# Patient Record
Sex: Female | Born: 1970
Health system: Southern US, Community
[De-identification: ages and names within clinical notes are randomized; demographics above are authoritative.]

## PROBLEM LIST (undated history)

## (undated) HISTORY — PX: OTHER SURGICAL HISTORY: SHX169

## (undated) HISTORY — PX: DILATION AND CURETTAGE OF UTERUS: SHX78

## (undated) HISTORY — PX: MYRINGOTOMY: SUR874

---

## 1998-03-30 ENCOUNTER — Other Ambulatory Visit: Admission: RE | Admit: 1998-03-30 | Discharge: 1998-03-30 | Payer: Self-pay | Admitting: Gynecology

## 1998-04-18 ENCOUNTER — Inpatient Hospital Stay (HOSPITAL_COMMUNITY): Admission: AD | Admit: 1998-04-18 | Discharge: 1998-04-20 | Payer: Self-pay | Admitting: Obstetrics and Gynecology

## 1998-06-04 ENCOUNTER — Other Ambulatory Visit: Admission: RE | Admit: 1998-06-04 | Discharge: 1998-06-04 | Payer: Self-pay | Admitting: Gynecology

## 1999-09-26 ENCOUNTER — Other Ambulatory Visit: Admission: RE | Admit: 1999-09-26 | Discharge: 1999-09-26 | Payer: Self-pay | Admitting: Gynecology

## 2000-05-04 ENCOUNTER — Emergency Department (HOSPITAL_COMMUNITY): Admission: EM | Admit: 2000-05-04 | Discharge: 2000-05-05 | Payer: Self-pay | Admitting: Emergency Medicine

## 2000-06-22 ENCOUNTER — Ambulatory Visit (HOSPITAL_COMMUNITY): Admission: RE | Admit: 2000-06-22 | Discharge: 2000-06-22 | Payer: Self-pay | Admitting: Gynecology

## 2000-06-22 ENCOUNTER — Encounter (INDEPENDENT_AMBULATORY_CARE_PROVIDER_SITE_OTHER): Payer: Self-pay

## 2000-10-16 ENCOUNTER — Other Ambulatory Visit: Admission: RE | Admit: 2000-10-16 | Discharge: 2000-10-16 | Payer: Self-pay | Admitting: *Deleted

## 2001-05-15 ENCOUNTER — Inpatient Hospital Stay (HOSPITAL_COMMUNITY): Admission: AD | Admit: 2001-05-15 | Discharge: 2001-05-17 | Payer: Self-pay | Admitting: Gynecology

## 2001-06-26 ENCOUNTER — Other Ambulatory Visit: Admission: RE | Admit: 2001-06-26 | Discharge: 2001-06-26 | Payer: Self-pay | Admitting: *Deleted

## 2002-06-25 ENCOUNTER — Other Ambulatory Visit: Admission: RE | Admit: 2002-06-25 | Discharge: 2002-06-25 | Payer: Self-pay | Admitting: *Deleted

## 2005-02-02 ENCOUNTER — Other Ambulatory Visit: Admission: RE | Admit: 2005-02-02 | Discharge: 2005-02-02 | Payer: Self-pay | Admitting: Gynecology

## 2006-05-24 ENCOUNTER — Other Ambulatory Visit: Admission: RE | Admit: 2006-05-24 | Discharge: 2006-05-24 | Payer: Self-pay | Admitting: Gynecology

## 2011-05-05 NOTE — Op Note (Signed)
[facility]  Patient:    Rachael Murray, Rachael Murray                     MRN: [id number] Proc. Date: 06/22/00 Adm. Date:  [id number] Disc. Date: [id number] Attending:  Tonye Royalty                           Operative Report  PREOPERATIVE DIAGNOSIS:       First trimester missed abortion.  POSTOPERATIVE DIAGNOSIS:      First trimester missed abortion.  OPERATION:                    Dilatation and evacuation.  SURGEON:                      Juan H. Lily Peer, M.D.  ASSISTANT:  ANESTHESIA:                   MAC and paracervical block with 2% Xylocaine with 1:100,000 epinephrine.  ESTIMATED BLOOD LOSS:  INDICATIONS: The patient is a 40 year old gravida 3, para 1, AB 1, first trimester with evidence of intrauterine pregnancy but with nonviable gestational sac noted. Intrauterine and leveling off quantitative beta hCGs.  DESCRIPTION OF PROCEDURE: After the patient was adequately counseled, she was taken to the operating room where she was given MAC as a form of anesthesia. Her bladder was evacuated of its contents for approximately 50 cc. Examination demonstrated anteverted uterus, upper limits of normal, no palpable adnexal masses. A speculum was inserted into the vaginal vault. The vagina and perineum were prepped and draped in the usual sterile fashion. Then 2% Xylocaine with 1:100,000 epinephrine was infiltrated into the cervical stroma at the 2, 4, 8, and 10 oclock position for a total of 10 cc. The cervix required minimal dilatation with a Pratt dilator and following this, a Hunters curette was inserted into the uterine cavity and products of conception were scraped off in the intrauterine cavity. This was interchanged with a 7 mm suction curette and the specimen was passed off the operative field for histologic evaluation. Then 20 units of Pitocin was given in 500 cc of lactated ringers. The patient received 1 g of Cefotan for prophylaxis  and she was transferred to the recovery room with stable vital signs. She was to receive Toradol 30 mg IV, for postoperative analgesia. The vital signs were stable throughout the procedure. Blood type was 0 positive. IV fluids were 500 cc. Prophylaxis antibiotic with Cefotan 1 g IV. DD:  06/22/00 TD:  06/25/00 Job: [id number]

## 2011-05-05 NOTE — H&P (Signed)
[facility]  Patient:    Rachael Murray, Rachael Murray                     MRN: [id number] Adm. Date:  05/15/01 Dictator:   Gaetano Hawthorne. Lily Peer, M.D.                         History and Physical  CHIEF COMPLAINT:              Contractions.  HISTORY:                      The patient is a 40 year old gravida 4, para 1, AB 2, with a last menstrual period of August 03, 2000, estimated date of confinement May 23, 2001, currently at 38-6/7ths weeks gestation, who presented to [facility] at approximately 0120 hours complaining of contractions that started approximately at 2300 hours on May 28.  She was placed on the monitor.  She was found to be contracting every 2-3 minutes apart and her cervix was found to be 3-4 cm dilated, 80% effaced, vertex presentation, -2 station.  An isolated late deceleration was noted in maternity admissions down to 150 beats per minute which lasted for 50 seconds and it recovered and returned back to baseline.  Upon arrival to labor and delivery the patients cervix was found to be 6-7 cm, 80% effaced, -3 station with intact membranes.  There was questionable presentation and a bedside ultrasound reconfirmed indeed the fetus was in the vertex presentation.  The patients prenatal course is significant for the fact that the patient had been offered genetic testing due to the fact that her father-in-law has Huntington chorea, but her husband has never been tested for it and she had declined any further testing since her other pregnancy she stated everything went well with the exception that she had a positive group B strep culture. The patient also stated she has a sister who has two children with cleft lip and another one with autism.  Based on all the above, the patient had been recommended to undergo genetic counseling, but had declined.  Otherwise her prenatal course had been uneventful.  Of note, she also had declined maternal serum  alpha-fetoprotein testing as well.  Screening ultrasound did not demonstrate any gross anomalies.  ALLERGIES:                    She is allergic to ASPIRIN.  FAMILY HISTORY:               Huntingtons disease; the patient declined genetic testing and she also declined maternal serum alpha-fetoprotein testing.  OBSTETRICAL HISTORY:          She had a spontaneous AB in 1993 and also in 2001.  She did have a normal spontaneous vaginal delivery in May 1999, of an 8 pound 12 ounce baby at 39-1/[redacted] weeks gestation.  As noted above, the patient has had a nephew with autism.  REVIEW OF SYSTEMS:            See [place] form.  PHYSICAL EXAMINATION:  VITAL SIGNS:                  Blood pressure was 132/74, respirations 20, pulse 79, temperature 98.4.  HEENT:                        Unremarkable.  NECK:  Supple, trachea midline, no carotid bruits, no thyromegaly.  LUNGS:                        Clear to auscultation without rhonchi, rales or wheezes.  HEART:                        Regular rate and rhythm, no murmurs or gallops.  BREAST EXAM:                  Not done.  ABDOMEN:                      Gravid uterus, vertex presentation by [employer].  Positive fetal heart tones.  PELVIC EXAM:                  Cervix is 6-7 cm dilated, 80-90% effaced, -3 station, intact membranes.  EXTREMITIES:                  DTRs 1+.  Negative clonus.  Trace edema.  PRENATAL LABORATORIES:        O positive blood type.  Negative antibody screen.  VDRL was nonreactive.  Rubella immune.  Hepatitis B surface antigen and HIV were negative.  Pap smear was normal.  Alpha-fetoprotein was declined. Diabetes screen was normal.  ASSESSMENT:                   A 40 year old gravida 2, para 1, AB 2, currently at 38-6/7ths weeks gestation in active labor.  She has had spontaneous contractions every 2-3 minutes apart with a reassuring fetal heart rate tracing.  The patient underwent a  bedside ultrasound to confirm the presentation and, indeed, it was vertex.  She also underwent artificial rupture of membranes with clear amniotic fluid noted.  A scalp electrode and IUPC were placed.  She did have an epidural for pain management.  In the event of protracted labor, will then proceed then with Pitocin augmentation.  Due to the fact that she had a history of a positive group B strep in the past, will start treating her prophylactically with 5 MU of penicillin G, followed by 2.5 MU IV q.4h.  PLAN:                         As per assessment above and anticipate vaginal delivery. DD:  05/15/01 TD:  05/15/01 Job: [id number]

## 2011-05-05 NOTE — H&P (Signed)
[facility]  Patient:    Rachael Murray, Rachael Murray                     MRN: [id number] Adm. Date:  [id number] Attending:  Tonye Royalty                         History and Physical  CHIEF COMPLAINT:                  Missed AB.  HISTORY:                          The patient is a 40 year old, gravida 3, para 1, AB 1, who is currently 8-1/2 weeks by her last menstrual period.  She has been complaining of vaginal bleeding for several days.  She was followed by quantitative beta HCGs since July 2.  Her quantitative beta HCG was 875, and on July 4, it was 101.9.  The patient presented today for followup quantitative beta HCG and was leveled off at 940 million international units per milliliter and subsequently had an ultrasound which demonstrated an empty gestational sac at approximately [redacted] weeks gestation with normal ovaries.  PAST MEDICAL HISTORY:             The patient had D&C for a first trimester AB with her first pregnancy.  She had normal spontaneous vaginal delivery wit her second pregnancy.  No history of any major medical problems or other hospitalizations.  ALLERGIES:                        She denies any allergies.  SOCIAL HISTORY:                   She does not smoke.  No alcohol consumption reported.  PHYSICAL EXAMINATION:  GENERAL:                          A well-developed, well-nourished female.  HEENT:                            Unremarkable.  NECK:                             Supple.  Trachea midline.  No carotid bruits or thyromegaly.  LUNGS:                            Clear to auscultation without rhonchi or wheezes.  HEART:                            Regular rate and rhythm.  No murmurs or gallops.  BREASTS:                          Examination not done.  ABDOMEN:                          Soft, nontender, without rebound or guarding.  PELVIC:                           Bartholin, urethral, and Skene within  normal limits.  Vagina and cervix: Some blood was noted in the vaginal vault.  Cervix was somewhat dilated, perhaps 1 cm.  Uterus anteverted, upper limits of normal, approximately six-week size.  Adnexa: No masses or tenderness.  RECTAL:                           Exam deferred.  ASSESSMENT:                       A 40 year old, gravida 3, para 1, abortus 1, with first trimester missed abortion.  She has been n.p.o. for the past 10 hours pending the results of the above-mentioned tests.  The patient is scheduled this afternoon for a D&E.  Risks, benefits, pros, and cons of the procedure were discussed to include infection, trauma to internal organs, hemorrhage.  The patient will receive prophylactic antibiotics.  Also in the event of perforation of the uterus, and exploratory laparotomy may be needed in an effort to correct any internal damage that may have occurred.  Also, in the event of uncontrollable hemorrhage, the patient is aware that, if she were to receive a blood transfusion, there is a risk of hepatitis, AIDs, or anaphylactic reaction.  The patient understands all of the above.  All questions were answered.  Her husband was present during the counselling and will follow accordingly.  PLAN:                             The patient is scheduled for D&E this afternoon at [facility].  Please note that her blood type is O positive. DD:  06/22/00 TD:  06/22/00 Job: [id number]

## 2011-05-05 NOTE — H&P (Signed)
[facility]  Patient:    Rachael Murray, Rachael Murray                     MRN: [id number] Attending:  Gaetano Hawthorne. Lily Peer, M.D.                         History and Physical  NO DICTATION DD:  06/22/00 TD:  06/22/00 Job: [id number]

## 2011-05-05 NOTE — Discharge Summary (Signed)
[facility]  Patient:    Rachael Murray, Rachael Murray                     MRN: [id number] Adm. Date:  [id number] Disc. Date: [id number] Attending:  Tonye Royalty Dictator:   Antony Contras, [facility]                           Discharge Summary  DISCHARGE DIAGNOSES:            Intrauterine pregnancy at term with spontaneous onset of labor.  PROCEDURES:                     Vacuum-assisted vaginal delivery of viable infant over a midline episiotomy.  HISTORY OF PRESENT ILLNESS:     The patient is a 40 year old gravida 4, para 1-0-2-1 with LMP date of August 03, 2000 and [facility] of May 23, 2001.  Prenatal course was complicated by family history of Huntingtons chorea.  The patient did decline all genetic studies as well as an MSAFP.  LABORATORY DATA:                Blood type O positive.  Antibody screen negative.  RPR is nonreactive.  Rubella immune.  HOSPITAL COURSE:                The patient presented on May 15, 2001, with spontaneous onset of labor.  Her cervix was found to be 3-4 cm, vertex at -2 station.  Labor did progress to complete dilatation.  She delivered per vacuum assistance secondary to OP presentation an Apgar 8/9 female infant weighing 8 pounds 4 ounces over a midline episiotomy.  The placenta was intact with three vessel cord.  Postpartum she remained afebrile.  She had no difficulty voiding and was able to be discharged in satisfactory condition on her second postpartum day.  CBC: Hematocrit 32.7, hemoglobin 11.5, WBC 15.6, platelets 218,000.  DISPOSITION:                    Follow up in six weeks.  Continue prenatal vitamins and iron, Motrin.  Tylox for pain. DD:  06/03/01 TD:  06/03/01 Job: [id number]

## 2011-10-30 ENCOUNTER — Encounter: Payer: Self-pay | Admitting: *Deleted

## 2011-10-30 ENCOUNTER — Emergency Department
Admission: EM | Admit: 2011-10-30 | Discharge: 2011-10-30 | Disposition: A | Discharge status: Home / Self Care | Payer: 59 | Source: Home / Self Care | Attending: Emergency Medicine | Admitting: Emergency Medicine

## 2011-10-30 DIAGNOSIS — M545 Low back pain, unspecified: Secondary | ICD-10-CM

## 2011-10-30 MED ORDER — CYCLOBENZAPRINE HCL 10 MG PO TABS: 10.0000 mg | ORAL_TABLET | Freq: Three times a day (TID) | ORAL | Status: AC | PRN

## 2011-10-30 MED ORDER — PREDNISONE (PAK) 10 MG PO TABS: 10.0000 mg | ORAL_TABLET | Freq: Every day | ORAL | Status: AC

## 2011-10-30 NOTE — ED Provider Notes (Signed)
History     CSN: [id number] Arrival date & time: 10/30/2011  8:28 AM   First MD Initiated Contact with Patient 10/30/11 [phone]      Chief Complaint  Patient presents with  . Back Pain    (Consider location/radiation/quality/duration/timing/severity/associated sxs/prior treatment) HPI The patient presents today with back pain. 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 Location: R buttock, mild radiation occasionally Timing: constant, intermittantly worse Description: tight Worse with: bending forward Better with: walking around, heat, Ibuprofen 800 Trauma: no Bladder/bowel incontinence: no Weakness: no Fever/chills: no Night pain: no Unexplained weight loss: no Cancer/immunosuppression: no PMH of osteoporosis or chronic steroid use:  no   Past Medical History  Diagnosis Date  . Migraine     Past Surgical History  Procedure Date  . Myringotomy   . Dilation and curettage of uterus   . Fractured arm w/ pins     Family History  Problem Relation Age of Onset  . Hypertension Father     History  Substance Use Topics  . Smoking status: Never Smoker   . Smokeless tobacco: Not on file  . Alcohol Use: No    OB History    Grav Para Term Preterm Abortions TAB SAB Ect Mult Living                  Review of Systems  Allergies  Aspirin  Home Medications   Current Outpatient Rx  Name Route Sig Dispense Refill  . ELETRIPTAN HYDROBROMIDE 20 MG PO TABS Oral One tablet by mouth as needed for migraine headache.  If the headache improves and then returns, dose may be repeated after 2 hours have elapsed since first dose (do not exceed 80 mg per day). may repeat in 2 hours if necessary       BP 111/78  Pulse 82  Temp(Src) 98.1 F (36.7 C) (Oral)  Resp 18  Ht 5\' 8"  (1.727 m)  Wt 171 lb 4 oz (77.678 kg)  BMI 26.04 kg/m2  SpO2 99%  LMP 10/30/2011  Physical Exam  Nursing note and vitals reviewed. Constitutional:  She is oriented to person, place, and time. She appears well-developed and well-nourished.  HENT:  Head: Normocephalic and atraumatic.  Neck: Neck supple.  Cardiovascular: Regular rhythm and normal heart sounds.   Pulmonary/Chest: Effort normal and breath sounds normal. No respiratory distress.  Musculoskeletal:       Lumbar back: She exhibits tenderness, pain and spasm. She exhibits no bony tenderness.       Most TTP located R sciatic notch / piriformis, but also at R SI joint  Neurological: She is alert and oriented to person, place, and time. She has normal strength and normal reflexes. No sensory deficit.  Skin: Skin is warm and dry. No rash noted.  Psychiatric: She has a normal mood and affect. Her speech is normal.    ED Course  Procedures (including critical care time)  Labs Reviewed - No data to display No results found.   No diagnosis found.    MDM   Lumbar back strain / Piriformis syndrome - Rx for Flexeril and Prednisone.  Limit heavy lifting, pushing, pulling.  Heating pad.  If not improving, refer to PT.  Then if still not better or new symptoms, would get Xray and/or refer to sports medicine.    Lily Kocher, MD 10/30/11 [phone]

## 2011-10-30 NOTE — ED Notes (Signed)
Pt c/o RT sided LBP/hip pain x 1 1/2 wk, after lifting a 40 lb bag a dog food. She has taken IBF with no relief.

## 2011-11-16 ENCOUNTER — Emergency Department
Admit: 2011-11-16 | Discharge: 2011-11-16 | Disposition: A | Discharge status: Home / Self Care | Payer: 59 | Attending: Emergency Medicine | Admitting: Emergency Medicine

## 2011-11-16 ENCOUNTER — Emergency Department
Admission: EM | Admit: 2011-11-16 | Discharge: 2011-11-16 | Disposition: A | Discharge status: Home / Self Care | Payer: 59 | Source: Home / Self Care | Attending: Emergency Medicine | Admitting: Emergency Medicine

## 2011-11-16 ENCOUNTER — Encounter: Payer: Self-pay | Admitting: *Deleted

## 2011-11-16 DIAGNOSIS — S335XXA Sprain of ligaments of lumbar spine, initial encounter: Secondary | ICD-10-CM

## 2011-11-16 MED ORDER — ETODOLAC 500 MG PO TABS: 500.0000 mg | ORAL_TABLET | Freq: Two times a day (BID) | ORAL | Status: AC

## 2011-11-16 MED ORDER — HYDROCODONE-ACETAMINOPHEN 5-500 MG PO TABS: 1.0000 | ORAL_TABLET | Freq: Four times a day (QID) | ORAL | Status: AC | PRN

## 2011-11-16 MED ORDER — PREDNISONE (PAK) 10 MG PO TABS: ORAL_TABLET | ORAL | Status: AC

## 2011-11-16 NOTE — ED Notes (Signed)
Pt c/o low back pain radiating to bilateral legs x 3wks. She was seen 2 wks ago given flexeril and prednisone, she states that the prednisone helped some and she does not like the flexeril. She states that the pain became worse again x 1 day. She has taken IBF 800 mg.

## 2011-11-16 NOTE — ED Provider Notes (Signed)
History     CSN: [id number] Arrival date & time: 11/16/2011 10:57 AM   First MD Initiated Contact with Patient 11/16/11 1125      Chief Complaint  Patient presents with  . Back Pain    (Consider location/radiation/quality/duration/timing/severity/associated sxs/prior treatment) HPI Comments: Ryder was seen here 10/30/11 for same low back pain. I have reviewed.extensive note when she saw Dr. Orson Aloe. Diagnosis at that time was low back pain she was treated with a prednisone Dosepak which helped significantly and she felt 85% better after completing the prednisone Dosepak. The Flexeril made her drowsy. Ibuprofen may have helped somewhat at the time.  However, her mid lumbar and lower mid lumbar pain has now recurred over the past several days and now much worse especially when she stands or sits or moves. The pain is sharp, worse with activity, radiates posteriorly down both of her thighs to the mid thighs but not beyond. She denies numbness, weakness, incontinence, or bowel or bladder dysfunction. Prior to this month, she denies any prior back problems or any history of back surgery.  She tried ibuprofen 800 mg 3 times a day since yesterday, and that's not helping. She tried the Flexeril and that may have helped a little but it made her too drowsy so she is avoiding that. She works as a Engineer, civil (consulting).  She states she is very frustrated and she is weeping when providing the history. She states that she and husband and children are planning to leave tomorrow to drive to [place] to [employer] for a one-week vacation.  Patient is a 40 y.o. female presenting with back pain.  Back Pain  Pertinent negatives include no numbness and no weakness.    Past Medical History  Diagnosis Date  . Migraine     Past Surgical History  Procedure Date  . Myringotomy   . Dilation and curettage of uterus   . Fractured arm w/ pins     Family History  Problem Relation Age of Onset  . Hypertension Father      History  Substance Use Topics  . Smoking status: Never Smoker   . Smokeless tobacco: Not on file  . Alcohol Use: No    OB History    Grav Para Term Preterm Abortions TAB SAB Ect Mult Living                  Review of Systems  Constitutional: Negative.   HENT: Negative.   Eyes: Negative.   Respiratory: Negative.   Cardiovascular: Negative.   Gastrointestinal: Negative.   Musculoskeletal: Positive for back pain.  Neurological: Negative for seizures, speech difficulty, weakness and numbness.  Hematological: Does not bruise/bleed easily.   She denies chance of pregnancy Allergies  Aspirin  Home Medications   Current Outpatient Rx  Name Route Sig Dispense Refill  . ELETRIPTAN HYDROBROMIDE 20 MG PO TABS Oral One tablet by mouth as needed for migraine headache.  If the headache improves and then returns, dose may be repeated after 2 hours have elapsed since first dose (do not exceed 80 mg per day). may repeat in 2 hours if necessary       BP 120/80  Pulse 114  Temp(Src) 98.4 F (36.9 C) (Oral)  Resp 18  Ht 5\' 9"  (1.753 m)  Wt 173 lb (78.472 kg)  BMI 25.55 kg/m2  SpO2 98%  LMP 10/30/2011  Physical Exam  Nursing note and vitals reviewed. Constitutional: She is oriented to person, place, and time. She appears well-developed and  well-nourished. She is cooperative.  Non-toxic appearance. She appears distressed (Appears uncomfortable from low back pain.).  HENT:  Head: Normocephalic and atraumatic.  Mouth/Throat: Oropharynx is clear and moist.  Eyes: EOM are normal. Pupils are equal, round, and reactive to light. No scleral icterus.  Neck: Neck supple.  Cardiovascular: Regular rhythm and normal heart sounds.   Pulmonary/Chest: Effort normal and breath sounds normal. No respiratory distress. She has no wheezes. She has no rales. She exhibits no tenderness.  Abdominal: Soft. There is no tenderness.  Musculoskeletal:       Right hip: Normal.       Left hip: Normal.         Cervical back: She exhibits no tenderness.       Thoracic back: She exhibits no tenderness.       Lumbar back: She exhibits decreased range of motion, tenderness, bony tenderness and spasm. She exhibits no swelling, no edema, no deformity, no laceration and normal pulse.       Negative Right straight leg-raise test. Negative Left straight leg-raise test.  Negative Right Luisa Hart test. Negative Left Luisa Hart test.    Neurological: She is alert and oriented to person, place, and time. She has normal strength. She displays no atrophy, no tremor and normal reflexes. No cranial nerve deficit or sensory deficit. She exhibits normal muscle tone. Gait normal.  Reflex Scores:      Patellar reflexes are 2+ on the right side and 2+ on the left side.      Achilles reflexes are 2+ on the right side and 2+ on the left side. Skin: Skin is warm, dry and intact. No lesion and no rash noted.  Psychiatric: She has a normal mood and affect.   minimal tenderness to palpation over sciatic notches on the right and the left.  ED Course  Procedures (including critical care time)  Labs Reviewed - No data to display No results found. X-ray ordered for complete views of the LS-spine    MDM  LUMBAR SPINE - COMPLETE 4+ VIEW  Comparison: None.  Findings:  There are six non-rib bearing lumbar type vertebral bodies. For  the purposes of this dictation, lumbar vertebral bodies with  labeled L1-L6. Normal alignment of the lumbar spine. No  anterolisthesis or retrolisthesis. No pars defects.  Vertebral body heights and intervertebral disc spaces are  preserved. Limited visualization of the bilateral SI joints are  normal. 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  Nonobstructive obstructive bowel gas pattern.  IMPRESSION:  Unremarkable radiographs of the lumbar spine.  Original Report Authenticated By: Waynard Reeds, M.D  I reviewed the above x-ray results with her. Her diagnosis of  lumbar pain is likely from lumbar strain. Less likely that this would be discogenic, as her neurologic exam is intact. However it is possible that there could be an element of neurologic impingement causing her pain radiating to her thighs, as she did improve with the prednisone two weeks ago. After risks, benefits, alternatives discuss, decided to treat with another prednisone 6 day dose pack, Lodine 500 mg BID pc, and judicious use of Vicodin.# 15, no refills Precautions discussed. She will follow-up with Dr. Dutch Quint, a neurosurgeon/back specialist that she knows, within the next 10 days, or sooner if worse or new symptoms. Other nonpharmacologic measures discussed. She agrees with the above plans and voiced understanding.         Lonell Face, MD 11/18/11 [phone]

## 2012-07-11 ENCOUNTER — Other Ambulatory Visit (HOSPITAL_COMMUNITY): Payer: Self-pay | Admitting: Family Medicine

## 2012-07-11 DIAGNOSIS — R109 Unspecified abdominal pain: Secondary | ICD-10-CM

## 2012-07-15 ENCOUNTER — Ambulatory Visit (HOSPITAL_COMMUNITY)
Admission: RE | Admit: 2012-07-15 | Discharge: 2012-07-15 | Disposition: A | Discharge status: Home / Self Care | Payer: 59 | Source: Ambulatory Visit | Attending: Family Medicine | Admitting: Family Medicine

## 2012-07-15 DIAGNOSIS — R109 Unspecified abdominal pain: Secondary | ICD-10-CM | POA: Insufficient documentation

## 2013-07-22 ENCOUNTER — Other Ambulatory Visit (HOSPITAL_COMMUNITY): Payer: Self-pay | Admitting: Family Medicine

## 2013-07-22 DIAGNOSIS — D179 Benign lipomatous neoplasm, unspecified: Secondary | ICD-10-CM

## 2013-07-29 ENCOUNTER — Ambulatory Visit (HOSPITAL_COMMUNITY): Payer: 59

## 2014-10-02 ENCOUNTER — Other Ambulatory Visit: Payer: Self-pay | Admitting: Family Medicine

## 2014-10-02 DIAGNOSIS — E049 Nontoxic goiter, unspecified: Secondary | ICD-10-CM

## 2014-10-05 ENCOUNTER — Other Ambulatory Visit: Payer: Self-pay | Admitting: Family Medicine

## 2014-10-05 DIAGNOSIS — Z1231 Encounter for screening mammogram for malignant neoplasm of breast: Secondary | ICD-10-CM

## 2014-10-15 ENCOUNTER — Ambulatory Visit (INDEPENDENT_AMBULATORY_CARE_PROVIDER_SITE_OTHER): Payer: 59

## 2014-10-15 DIAGNOSIS — E041 Nontoxic single thyroid nodule: Secondary | ICD-10-CM

## 2014-10-15 DIAGNOSIS — Z1231 Encounter for screening mammogram for malignant neoplasm of breast: Secondary | ICD-10-CM

## 2014-10-15 DIAGNOSIS — E049 Nontoxic goiter, unspecified: Secondary | ICD-10-CM

## 2014-11-19 ENCOUNTER — Telehealth (INDEPENDENT_AMBULATORY_CARE_PROVIDER_SITE_OTHER): Payer: Self-pay

## 2014-11-19 DIAGNOSIS — E041 Nontoxic single thyroid nodule: Secondary | ICD-10-CM

## 2014-11-19 NOTE — Telephone Encounter (Signed)
Pt seen in office today by Dr Rosendo Gros and orders placed in Epic for CT neck along FNA of thyroid.

## 2014-11-23 ENCOUNTER — Ambulatory Visit (HOSPITAL_COMMUNITY)
Admission: RE | Admit: 2014-11-23 | Discharge: 2014-11-23 | Disposition: A | Discharge status: Home / Self Care | Payer: 59 | Source: Ambulatory Visit | Attending: General Surgery | Admitting: General Surgery

## 2014-11-23 DIAGNOSIS — E041 Nontoxic single thyroid nodule: Secondary | ICD-10-CM | POA: Diagnosis present

## 2014-11-23 MED ORDER — IOHEXOL 300 MG/ML  SOLN
75.0000 mL | Freq: Once | INTRAMUSCULAR | Status: AC | PRN
  Administered 2014-11-23: 75 mL via INTRAVENOUS

## 2014-11-24 ENCOUNTER — Ambulatory Visit
Admission: RE | Admit: 2014-11-24 | Discharge: 2014-11-24 | Disposition: A | Discharge status: Home / Self Care | Payer: 59 | Source: Ambulatory Visit | Attending: General Surgery | Admitting: General Surgery

## 2014-11-24 ENCOUNTER — Other Ambulatory Visit (HOSPITAL_COMMUNITY)
Admission: RE | Admit: 2014-11-24 | Discharge: 2014-11-24 | Disposition: A | Discharge status: Home / Self Care | Payer: 59 | Source: Ambulatory Visit | Attending: Interventional Radiology | Admitting: Interventional Radiology

## 2014-11-24 DIAGNOSIS — E041 Nontoxic single thyroid nodule: Secondary | ICD-10-CM

## 2014-11-26 ENCOUNTER — Telehealth (INDEPENDENT_AMBULATORY_CARE_PROVIDER_SITE_OTHER): Payer: Self-pay | Admitting: General Surgery

## 2015-01-15 NOTE — Progress Notes (Signed)
Please put orders in Epic surgery 01-27-15 pre op 01-19-15 Thanks

## 2015-01-18 NOTE — Patient Instructions (Addendum)
Rachael Murray  01/18/2015   Your procedure is scheduled on: 02/03/2015    Report to [facility] Main  Entrance and follow signs to               [place] at       [place].  Call this number if you have problems the morning of surgery [phone]   Remember:  Do not eat food or drink liquids :After Midnight.     Take these medicines the morning of surgery with A SIP OF WATER:                                 You may not have any metal on your body including hair pins and              piercings  Do not wear jewelry, make-up, lotions, powders or perfumes.             Do not wear nail polish.  Do not shave  48 hours prior to surgery.     Do not bring valuables to the hospital. [place].  Contacts, dentures or bridgework may not be worn into surgery.  Leave suitcase in the car. After surgery it may be brought to your room.       Special Instructions: coughing and deep breathing exercises, leg exercises               Please read over the following fact sheets you were given: _____________________________________________________________________             [facility] - Preparing for Surgery Before surgery, you can play an important role.  Because skin is not sterile, your skin needs to be as free of germs as possible.  You can reduce the number of germs on your skin by washing with CHG (chlorahexidine gluconate) soap before surgery.  CHG is an antiseptic cleaner which kills germs and bonds with the skin to continue killing germs even after washing. Please DO NOT use if you have an allergy to CHG or antibacterial soaps.  If your skin becomes reddened/irritated stop using the CHG and inform your nurse when you arrive at Short Stay. Do not shave (including legs and underarms) for at least 48 hours prior to the first CHG shower.  You may shave your face/neck. Please follow these instructions carefully:  1.   Shower with CHG Soap the night before surgery and the  morning of Surgery.  2.  If you choose to wash your hair, wash your hair first as usual with your  normal  shampoo.  3.  After you shampoo, rinse your hair and body thoroughly to remove the  shampoo.                           4.  Use CHG as you would any other liquid soap.  You can apply chg directly  to the skin and wash                       Gently with a scrungie or clean washcloth.  5.  Apply the CHG Soap to your body ONLY FROM THE NECK DOWN.  Do not use on face/ open                           Wound or open sores. Avoid contact with eyes, ears mouth and genitals (private parts).                       Wash face,  Genitals (private parts) with your normal soap.             6.  Wash thoroughly, paying special attention to the area where your surgery  will be performed.  7.  Thoroughly rinse your body with warm water from the neck down.  8.  DO NOT shower/wash with your normal soap after using and rinsing off  the CHG Soap.                9.  Pat yourself dry with a clean towel.            10.  Wear clean pajamas.            11.  Place clean sheets on your bed the night of your first shower and do not  sleep with pets. Day of Surgery : Do not apply any lotions/deodorants the morning of surgery.  Please wear clean clothes to the hospital/surgery center.  FAILURE TO FOLLOW THESE INSTRUCTIONS MAY RESULT IN THE CANCELLATION OF YOUR SURGERY PATIENT SIGNATURE_________________________________  NURSE SIGNATURE__________________________________  ________________________________________________________________________

## 2015-01-19 ENCOUNTER — Encounter (HOSPITAL_COMMUNITY)
Admission: RE | Admit: 2015-01-19 | Discharge: 2015-01-19 | Disposition: A | Discharge status: Home / Self Care | Payer: 59 | Source: Ambulatory Visit | Attending: General Surgery | Admitting: General Surgery

## 2015-01-19 ENCOUNTER — Ambulatory Visit (HOSPITAL_COMMUNITY)
Admission: RE | Admit: 2015-01-19 | Discharge: 2015-01-19 | Disposition: A | Discharge status: Home / Self Care | Payer: 59 | Source: Ambulatory Visit | Attending: Anesthesiology | Admitting: Anesthesiology

## 2015-01-19 ENCOUNTER — Encounter (HOSPITAL_COMMUNITY): Payer: Self-pay

## 2015-01-19 DIAGNOSIS — Z01818 Encounter for other preprocedural examination: Secondary | ICD-10-CM | POA: Diagnosis present

## 2015-01-19 LAB — HCG, SERUM, QUALITATIVE: Preg, Serum: NEGATIVE

## 2015-01-19 LAB — CBC
HEMATOCRIT: 40.4 % (ref 36.0–46.0)
HEMOGLOBIN: 13.2 g/dL (ref 12.0–15.0)
MCH: 27.8 pg (ref 26.0–34.0)
MCHC: 32.7 g/dL (ref 30.0–36.0)
MCV: 85.2 fL (ref 78.0–100.0)
PLATELETS: 224 10*3/uL (ref 150–400)
RBC: 4.74 MIL/uL (ref 3.87–5.11)
RDW: 13.6 % (ref 11.5–15.5)
WBC: 6.3 10*3/uL (ref 4.0–10.5)

## 2015-01-19 NOTE — Progress Notes (Addendum)
Need orders in EPIC .  Surgery on 02/03/15.  preop on 01/19/15 at 1100am.  Thank You.

## 2015-02-02 NOTE — H&P (Signed)
History of Present Illness Ralene Ok MD; 11/27/2014 3:02 PM) Patient words: f/u to discuss biopsy.  The patient is a 44 year old female who presents with a thyroid nodule. Patient is a 44 year old female who is referred by Dr. Betty Martinique for evaluation of a left thyroid nodule. Patient states this was found physical exam several weeks ago. She states prior to this she did not notice any swelling or enlargement of the left thyroid gland.  Patient underwent left thyroid biopsy. Biopsy reports reveal questionable and cannot rule out follicular neoplasm. I discussed this with the patient.   Allergies Festus Holts, LPN; 95/18/8416 6:06 PM) Aspirin *ANALGESICS - NonNarcotic*  Medication History Festus Holts, LPN; 30/16/0109 3:23 PM) Fish Oil Active. Relpax (20MG  Tablet, Oral prn) Active.  Review of Systems Ralene Ok MD; 11/27/2014 3:01 PM) General Present- Fatigue. Not Present- Appetite Loss, Chills, Fever, Night Sweats, Weight Gain and Weight Loss. Skin Not Present- Change in Wart/Mole, Dryness, Hives, Jaundice, New Lesions, Non-Healing Wounds, Rash and Ulcer. HEENT Present- Sore Throat. Not Present- Earache, Hearing Loss, Hoarseness, Nose Bleed, Oral Ulcers, Ringing in the Ears, Seasonal Allergies, Sinus Pain, Visual Disturbances, Wears glasses/contact lenses and Yellow Eyes. Respiratory Not Present- Bloody sputum, Chronic Cough, Difficulty Breathing, Snoring and Wheezing. Breast Not Present- Breast Mass, Breast Pain, Nipple Discharge and Skin Changes. Cardiovascular Not Present- Chest Pain, Difficulty Breathing Lying Down, Leg Cramps, Palpitations, Rapid Heart Rate, Shortness of Breath and Swelling of Extremities. Gastrointestinal Not Present- Abdominal Pain, Bloating, Bloody Stool, Change in Bowel Habits, Chronic diarrhea, Constipation, Difficulty Swallowing, Excessive gas, Gets full quickly at meals, Hemorrhoids, Indigestion, Nausea, Rectal Pain and Vomiting. Female  Genitourinary Not Present- Frequency, Nocturia, Painful Urination, Pelvic Pain and Urgency. Musculoskeletal Not Present- Back Pain, Joint Pain, Joint Stiffness, Muscle Pain, Muscle Weakness and Swelling of Extremities. Neurological Not Present- Decreased Memory, Fainting, Headaches, Numbness, Seizures, Tingling, Tremor, Trouble walking and Weakness. Psychiatric Not Present- Anxiety, Bipolar, Change in Sleep Pattern, Depression, Fearful and Frequent crying. Hematology Present- Gland problems. Not Present- Easy Bruising, Excessive bleeding, HIV and Persistent Infections.   Vitals Festus Holts LPN; 55/73/2202 5:42 PM) 11/27/2014 2:47 PM Weight: 178.25 lb Height: 69.5in Body Surface Area: 1.99 m Body Mass Index: 25.95 kg/m Temp.: 98.36F(Temporal)  Pulse: 78 (Regular)  Resp.: 16 (Unlabored)  BP: 116/72 (Sitting, Left Arm, Standard)    Physical Exam Ralene Ok MD; 11/27/2014 3:01 PM) General Mental Status-Alert. General Appearance-Consistent with stated age. Hydration-Well hydrated. Voice-Normal.  Head and Neck Head-normocephalic, atraumatic with no lesions or palpable masses. Neck -Note: Right supraclavicular fossa edema, nontender.  Trachea-midline. Thyroid Gland Characteristics - enlarged(Left thyroid nodule, non-fixed) and asymmetric.  Chest and Lung Exam Chest and lung exam reveals -quiet, even and easy respiratory effort with no use of accessory muscles and on auscultation, normal breath sounds, no adventitious sounds and normal vocal resonance. Inspection Chest Wall - Normal. Back - normal.  Cardiovascular Cardiovascular examination reveals -normal heart sounds, regular rate and rhythm with no murmurs and normal pedal pulses bilaterally.  Abdomen Inspection Normal Exam - No Hernias. Skin - Scar - no surgical scars. Palpation/Percussion Normal exam - Soft, Non Tender, No Rebound tenderness, No Rigidity (guarding) and No  hepatosplenomegaly. Auscultation Normal exam - Bowel sounds normal.  Musculoskeletal Normal Exam - Left-Upper Extremity Strength Normal and Lower Extremity Strength Normal. Normal Exam - Right-Upper Extremity Strength Normal and Lower Extremity Strength Normal.    Assessment & Plan Ralene Ok MD; 11/27/2014 3:03 PM) THYROID NODULE (241.0  E04.1) Impression: 44 year old female with a  left thyroid nodule  1. We will proceed to the operating room for a left thyroid lobectomy. 2. All risks and benefits were discussed with the patient to generally include: infection, bleeding, damage to the recurrent laryngeal nerve, damage to parathyroid glands, and possible need for further surgery. Alternatives were offered and described. All questions were answered and the patient voiced understanding of the procedure and wishes to proceed.

## 2015-02-03 ENCOUNTER — Encounter (HOSPITAL_COMMUNITY): Payer: Self-pay | Admitting: General Practice

## 2015-02-03 ENCOUNTER — Ambulatory Visit (HOSPITAL_COMMUNITY): Payer: 59 | Admitting: Anesthesiology

## 2015-02-03 ENCOUNTER — Observation Stay (HOSPITAL_COMMUNITY)
Admission: RE | Admit: 2015-02-03 | Discharge: 2015-02-04 | Disposition: A | Discharge status: Home / Self Care | Payer: 59 | Source: Ambulatory Visit | Attending: General Surgery | Admitting: General Surgery

## 2015-02-03 ENCOUNTER — Encounter (HOSPITAL_COMMUNITY)
Admission: RE | Disposition: A | Discharge status: Home / Self Care | Payer: Self-pay | Source: Ambulatory Visit | Attending: General Surgery

## 2015-02-03 DIAGNOSIS — D34 Benign neoplasm of thyroid gland: Principal | ICD-10-CM | POA: Insufficient documentation

## 2015-02-03 DIAGNOSIS — Z886 Allergy status to analgesic agent status: Secondary | ICD-10-CM | POA: Insufficient documentation

## 2015-02-03 DIAGNOSIS — E041 Nontoxic single thyroid nodule: Secondary | ICD-10-CM | POA: Diagnosis present

## 2015-02-03 DIAGNOSIS — E890 Postprocedural hypothyroidism: Secondary | ICD-10-CM

## 2015-02-03 DIAGNOSIS — Z9889 Other specified postprocedural states: Secondary | ICD-10-CM

## 2015-02-03 HISTORY — PX: THYROID LOBECTOMY: SHX420

## 2015-02-03 SURGERY — LOBECTOMY, THYROID
Anesthesia: General | Site: Neck | Laterality: Left

## 2015-02-03 MED ORDER — CEFAZOLIN SODIUM-DEXTROSE 2-3 GM-% IV SOLR
2.0000 g | INTRAVENOUS | Status: AC
  Administered 2015-02-03: 2 g via INTRAVENOUS

## 2015-02-03 MED ORDER — MEPERIDINE HCL 50 MG/ML IJ SOLN: 6.2500 mg | INTRAMUSCULAR | Status: DC | PRN

## 2015-02-03 MED ORDER — SUFENTANIL CITRATE 50 MCG/ML IV SOLN
INTRAVENOUS | Status: DC | PRN
  Administered 2015-02-03: 10 ug via INTRAVENOUS
  Administered 2015-02-03: 20 ug via INTRAVENOUS
  Administered 2015-02-03: 10 ug via INTRAVENOUS

## 2015-02-03 MED ORDER — CISATRACURIUM BESYLATE (PF) 10 MG/5ML IV SOLN
INTRAVENOUS | Status: DC | PRN
  Administered 2015-02-03: 2 mg via INTRAVENOUS
  Administered 2015-02-03: 6 mg via INTRAVENOUS

## 2015-02-03 MED ORDER — CHLORHEXIDINE GLUCONATE 4 % EX LIQD: 1.0000 "application " | Freq: Once | CUTANEOUS | Status: DC

## 2015-02-03 MED ORDER — OXYCODONE-ACETAMINOPHEN 5-325 MG PO TABS: 1.0000 | ORAL_TABLET | ORAL | Status: DC | PRN

## 2015-02-03 MED ORDER — DEXTROSE-NACL 5-0.9 % IV SOLN
INTRAVENOUS | Status: DC
  Administered 2015-02-03: 12:00:00 via INTRAVENOUS

## 2015-02-03 MED ORDER — DEXAMETHASONE SODIUM PHOSPHATE 10 MG/ML IJ SOLN
INTRAMUSCULAR | Status: DC | PRN
  Administered 2015-02-03: 10 mg via INTRAVENOUS

## 2015-02-03 MED ORDER — ONDANSETRON HCL 4 MG/2ML IJ SOLN: 4.0000 mg | Freq: Four times a day (QID) | INTRAMUSCULAR | Status: DC | PRN

## 2015-02-03 MED ORDER — BUPIVACAINE-EPINEPHRINE 0.25% -1:200000 IJ SOLN
INTRAMUSCULAR | Status: DC | PRN
  Administered 2015-02-03: 10 mL

## 2015-02-03 MED ORDER — HYDROMORPHONE HCL 1 MG/ML IJ SOLN: 0.5000 mg | INTRAMUSCULAR | Status: DC | PRN

## 2015-02-03 MED ORDER — ONDANSETRON 4 MG PO TBDP: 4.0000 mg | ORAL_TABLET | Freq: Three times a day (TID) | ORAL | Status: DC | PRN

## 2015-02-03 MED ORDER — NEOSTIGMINE METHYLSULFATE 10 MG/10ML IV SOLN
INTRAVENOUS | Status: DC | PRN
  Administered 2015-02-03: 3 mg via INTRAVENOUS

## 2015-02-03 MED ORDER — LACTATED RINGERS IV SOLN: INTRAVENOUS | Status: DC

## 2015-02-03 MED ORDER — PROMETHAZINE HCL 25 MG/ML IJ SOLN
6.2500 mg | INTRAMUSCULAR | Status: DC | PRN
  Administered 2015-02-03: 6.25 mg via INTRAVENOUS

## 2015-02-03 MED ORDER — NAPROXEN 500 MG PO TABS: 500.0000 mg | ORAL_TABLET | Freq: Every day | ORAL | Status: DC | PRN

## 2015-02-03 MED ORDER — PROMETHAZINE HCL 25 MG/ML IJ SOLN
INTRAMUSCULAR | Status: AC
  Filled 2015-02-03: qty 1

## 2015-02-03 MED ORDER — LACTATED RINGERS IV SOLN
INTRAVENOUS | Status: DC | PRN
  Administered 2015-02-03 (×2): via INTRAVENOUS

## 2015-02-03 MED ORDER — HYDROCODONE-ACETAMINOPHEN 5-325 MG PO TABS
1.0000 | ORAL_TABLET | ORAL | Status: DC | PRN
  Administered 2015-02-03 (×2): 2 via ORAL
  Filled 2015-02-03 (×2): qty 2

## 2015-02-03 MED ORDER — OMEGA-3-ACID ETHYL ESTERS 1 G PO CAPS
1.0000 g | ORAL_CAPSULE | Freq: Every day | ORAL | Status: DC
  Administered 2015-02-04: 1 g via ORAL
  Filled 2015-02-03: qty 1

## 2015-02-03 MED ORDER — IBUPROFEN 200 MG PO TABS: 400.0000 mg | ORAL_TABLET | Freq: Four times a day (QID) | ORAL | Status: DC | PRN

## 2015-02-03 MED ORDER — FENTANYL CITRATE 0.05 MG/ML IJ SOLN
INTRAMUSCULAR | Status: AC
  Filled 2015-02-03: qty 2

## 2015-02-03 MED ORDER — ONDANSETRON HCL 4 MG/2ML IJ SOLN
INTRAMUSCULAR | Status: DC | PRN
  Administered 2015-02-03: 4 mg via INTRAVENOUS

## 2015-02-03 MED ORDER — ELETRIPTAN HYDROBROMIDE 20 MG PO TABS: 20.0000 mg | ORAL_TABLET | ORAL | Status: DC | PRN

## 2015-02-03 MED ORDER — MIDAZOLAM HCL 5 MG/5ML IJ SOLN
INTRAMUSCULAR | Status: DC | PRN
  Administered 2015-02-03: 2 mg via INTRAVENOUS

## 2015-02-03 MED ORDER — PROPOFOL 10 MG/ML IV BOLUS
INTRAVENOUS | Status: DC | PRN
  Administered 2015-02-03: 150 mg via INTRAVENOUS

## 2015-02-03 MED ORDER — 0.9 % SODIUM CHLORIDE (POUR BTL) OPTIME
TOPICAL | Status: DC | PRN
  Administered 2015-02-03: 1000 mL

## 2015-02-03 MED ORDER — LIDOCAINE HCL (CARDIAC) 20 MG/ML IV SOLN
INTRAVENOUS | Status: DC | PRN
  Administered 2015-02-03: 100 mg via INTRAVENOUS

## 2015-02-03 MED ORDER — FENTANYL CITRATE 0.05 MG/ML IJ SOLN
25.0000 ug | INTRAMUSCULAR | Status: DC | PRN
  Administered 2015-02-03: 50 ug via INTRAVENOUS

## 2015-02-03 MED ORDER — SUCCINYLCHOLINE CHLORIDE 20 MG/ML IJ SOLN
INTRAMUSCULAR | Status: DC | PRN
  Administered 2015-02-03: 100 mg via INTRAVENOUS

## 2015-02-03 MED ORDER — GLYCOPYRROLATE 0.2 MG/ML IJ SOLN
INTRAMUSCULAR | Status: DC | PRN
  Administered 2015-02-03: .4 mg via INTRAVENOUS

## 2015-02-03 MED ORDER — SODIUM CHLORIDE 0.9 % IJ SOLN
INTRAMUSCULAR | Status: AC
  Filled 2015-02-03: qty 10

## 2015-02-03 SURGICAL SUPPLY — 42 items
ATTRACTOMAT 16X20 MAGNETIC DRP (DRAPES) ×2 IMPLANT
BLADE HEX COATED 2.75 (ELECTRODE) ×2 IMPLANT
BLADE SURG 15 STRL LF DISP TIS (BLADE) ×1 IMPLANT
BLADE SURG 15 STRL SS (BLADE) ×1
CHLORAPREP W/TINT 26ML (MISCELLANEOUS) ×2 IMPLANT
CLIP TI MEDIUM 6 (CLIP) ×4 IMPLANT
CLIP TI WIDE RED SMALL 6 (CLIP) ×4 IMPLANT
DISSECTOR ROUND CHERRY 3/8 STR (MISCELLANEOUS) ×2 IMPLANT
DRAPE LAPAROTOMY T 98X78 PEDS (DRAPES) ×2 IMPLANT
DRESSING SURGICEL FIBRLLR 1X2 (HEMOSTASIS) ×1 IMPLANT
DRSG SURGICEL FIBRILLAR 1X2 (HEMOSTASIS) ×2
ELECT REM PT RETURN 9FT ADLT (ELECTROSURGICAL) ×2
ELECTRODE REM PT RTRN 9FT ADLT (ELECTROSURGICAL) ×1 IMPLANT
GAUZE SPONGE 4X4 12PLY STRL (GAUZE/BANDAGES/DRESSINGS) IMPLANT
GAUZE SPONGE 4X4 16PLY XRAY LF (GAUZE/BANDAGES/DRESSINGS) ×2 IMPLANT
GLOVE BIO SURGEON STRL SZ7.5 (GLOVE) ×2 IMPLANT
GLOVE BIOGEL PI IND STRL 6.5 (GLOVE) ×1 IMPLANT
GLOVE BIOGEL PI IND STRL 7.0 (GLOVE) ×1 IMPLANT
GLOVE BIOGEL PI INDICATOR 6.5 (GLOVE) ×1
GLOVE BIOGEL PI INDICATOR 7.0 (GLOVE) ×1
GLOVE EUDERMIC 7 POWDERFREE (GLOVE) ×2 IMPLANT
GOWN STRL REUS W/TWL XL LVL3 (GOWN DISPOSABLE) ×6 IMPLANT
KIT BASIN OR (CUSTOM PROCEDURE TRAY) ×2 IMPLANT
LIQUID BAND (GAUZE/BANDAGES/DRESSINGS) ×2 IMPLANT
NEEDLE HYPO 25X1 1.5 SAFETY (NEEDLE) ×2 IMPLANT
PACK BASIC VI WITH GOWN DISP (CUSTOM PROCEDURE TRAY) ×2 IMPLANT
PENCIL BUTTON HOLSTER BLD 10FT (ELECTRODE) ×2 IMPLANT
SHEARS HARMONIC 9CM CVD (BLADE) ×2 IMPLANT
STAPLER VISISTAT 35W (STAPLE) IMPLANT
SUT ETHIBOND CT1 BRD #0 30IN (SUTURE) IMPLANT
SUT MNCRL AB 4-0 PS2 18 (SUTURE) ×2 IMPLANT
SUT SILK 2 0 (SUTURE) ×1
SUT SILK 2-0 18XBRD TIE 12 (SUTURE) ×1 IMPLANT
SUT SILK 3 0 (SUTURE) ×1
SUT SILK 3 0 SH CR/8 (SUTURE) ×2 IMPLANT
SUT SILK 3-0 18XBRD TIE 12 (SUTURE) ×1 IMPLANT
SUT VIC AB 3-0 SH 18 (SUTURE) ×4 IMPLANT
SYR BULB IRRIGATION 50ML (SYRINGE) ×2 IMPLANT
SYR CONTROL 10ML LL (SYRINGE) ×2 IMPLANT
TOWEL OR 17X26 10 PK STRL BLUE (TOWEL DISPOSABLE) ×2 IMPLANT
TOWEL OR NON WOVEN STRL DISP B (DISPOSABLE) ×2 IMPLANT
YANKAUER SUCT BULB TIP 10FT TU (MISCELLANEOUS) ×2 IMPLANT

## 2015-02-03 NOTE — Op Note (Signed)
02/03/2015  9:33 AM  PATIENT:  Rachael Murray  44 y.o. female  PRE-OPERATIVE DIAGNOSIS:  LEFT THYROID NODULE, FOLLICULAR  POST-OPERATIVE DIAGNOSIS:  LEFT THYROID NODULE, FOLLICULAR  PROCEDURE:  Procedure(s): LEFT THYROID LOBECTOMY  (Left)  SURGEON:  Surgeon(s) and Role:    * Ralene Ok, MD - Primary    * Fanny Skates, MD - Assisting  ANESTHESIA:   local and general  EBL:  Total I/O In: 1000 [I.V.:1000] Out: 56 [Blood:50]  BLOOD ADMINISTERED:none  DRAINS: none   LOCAL MEDICATIONS USED:  BUPIVICAINE   SPECIMEN:  Source of Specimen:  L thyroid lobe, stitch in superior lobe  DISPOSITION OF SPECIMEN:  PATHOLOGY  COUNTS:  YES  TOURNIQUET:  * No tourniquets in log *  DICTATION: .Dragon Dictation  Findings: large left thyroid lobe, no palpable findings in the right lobe  Indications for procedure: The patient is a 44 y/o F  who had a left thyroid nodule.    Details of the procedure:  The patient was taken back to the operating room. The patient was placed in supine position with bilateral SCDs in place.  The patient was prepped and draped in the usual sterile fashion. After appropriate anitbiotics were confirmed, a time-out was confirmed and all facts were verified.  A 4 cm incision was made approximately 2 fingerbreadths above the sternal notch. Bovie cautery was used to maintain hemostasis dissection was carried down through the platysma. The platysma was elevated and flaps were created superiorly and inferiorly to the thyroid cartilage as well as the sternal notch, repsectively. The strap muscles were identified in the midline and separated. The left-sided strap muscles were elevated off the anterior surface of the thyroid. This dissection was carried laterally. The middle thyroid vein was identified and doubly ligated. We proceeded to dissect away the superior lobe and Kitners were used to gently dissect the surrounding musculature from the thyroid. The superior  thyroid vessels were identified and doubly ligated with clips and divided with the Harmonic scapel. At this time this freed up the superior lobe was able to deliver this into the wound. We also identified the superior parathyroid gland which we preserved. We continued to dissect the thyroid off of the trachea from lateral to medial direction. The left recurrent laryngeal nerve was identified and protected. We proceeded to dissect the thyroid inferiorly and inferior parathyroid was identified and preserved. The inferior thyroid vessels were identified and doubly ligated with clips and transected with the Harmonic scapel. At this time Berry's ligament was dissected away from the trachea. This deliver the right lobe of the thyroid into the wound and the harmonic scalpel was used to divide the thyroid in the midline and encompassing the pyrimidal lobe. A superior stitch was then placed in the superior thyroid lobe. The area was irrigated out. The dissection bed was hemostatic. We placed fibrillar hemostatic agent into the wound. Strap muscles were then reapproximated in the midline with interrupted 3-0 Vicryl stitches. The platysma was reapproximated using 3-0 Vicryl stitches in interrupted fashion. Skin was then reapproximated using a running subcuticular 4-0 Monocryl. The skin was then dressed with Dermabond. The patient was taken to the recovery room in stable condition.     PLAN OF CARE: Admit for overnight observation  PATIENT DISPOSITION:  PACU - hemodynamically stable.   Delay start of Pharmacological VTE agent (>24hrs) due to surgical blood loss or risk of bleeding: not applicable

## 2015-02-03 NOTE — Transfer of Care (Signed)
Immediate Anesthesia Transfer of Care Note  Patient: Rachael Murray  Procedure(s) Performed: Procedure(s): LEFT THYROID LOBECTOMY  (Left)  Patient Location: PACU  Anesthesia Type:General  Level of Consciousness: awake and oriented  Airway & Oxygen Therapy: Patient Spontanous Breathing and Patient connected to nasal cannula oxygen  Post-op Assessment: Report given to RN and Post -op Vital signs reviewed and stable  Post vital signs: Reviewed and stable  Last Vitals:  Filed Vitals:   02/03/15 0615  BP: 122/84  Pulse: 96  Temp: 36.7 C  Resp: 18    Complications: No apparent anesthesia complications

## 2015-02-03 NOTE — Discharge Instructions (Signed)
Thyroidectomy °Care After °Refer to this sheet in the next few weeks. These instructions provide you with general information on caring for yourself after you leave the hospital. Your caregiver also may give you specific instructions. Your treatment has been planned according to the most current medical practices available, but problems sometimes occur. Call your caregiver if you have any problems or questions after your procedure. °HOME CARE INSTRUCTIONS  °· It is normal to be sore for a few weeks following surgery. See your caregiver if your pain seems to be getting worse rather than better. °· Only take over-the-counter or prescription medicines for pain, discomfort, or fever as directed by your caregiver. Avoid taking medicines that contain aspirin and ibuprofen because they increase the risk of bleeding. °· Shower rather than bathe until instructed otherwise by your caregiver. °· Change your bandages (dressings) as directed by your caregiver. °· You may resume a normal diet and activities as directed by your caregiver. °· Avoid lifting weight greater than 20 lb (9 kg) or participating in heavy exercise or contact sports for 10 days or as instructed by your caregiver. °· Make an appointment to see your caregiver for stitch (suture) or staple removal. °SEEK MEDICAL CARE IF:  °· You have increased bleeding from your wound. °· You have redness, swelling, or increasing pain from your wound or in your neck. °· There is pus coming from your wound. °· You have an oral temperature above 102° F (38.9° C). °· There is a bad smell coming from the wound or dressing. °· You develop lightheadedness or feel faint. °· You develop numbness, tingling, or muscle spasms in your arms, hands, feet, or face. °· You have difficulty swallowing. °SEEK IMMEDIATE MEDICAL CARE IF:  °· You develop a rash. °· You have difficulty breathing. °· You hear whistling noises that come from your chest. °· You develop a cough that becomes increasingly  worse. °· You develop any reaction or side effects to medicines given. °· There is swelling in your neck. °· You develop changes in speech or hoarseness, which is getting worse. °MAKE SURE YOU:  °· Understand these instructions. °· Will watch your condition. °· Will get help right away if you are not doing well or get worse. °Document Released: 06/23/2005 Document Revised: 02/26/2012 Document Reviewed: 02/10/2011 °ExitCare® Patient Information ©2015 [employer]. This information is not intended to replace advice given to you by your health care provider. Make sure you discuss any questions you have with your health care provider. ° °

## 2015-02-03 NOTE — Anesthesia Preprocedure Evaluation (Addendum)
Anesthesia Evaluation  Patient identified by MRN, date of birth, ID band Patient awake    Reviewed: Allergy & Precautions, NPO status , Patient's Chart, lab work & pertinent test results  Airway Mallampati: II  TM Distance: >3 FB Neck ROM: Full    Dental no notable dental hx.    Pulmonary neg pulmonary ROS,  breath sounds clear to auscultation  Pulmonary exam normal       Cardiovascular negative cardio ROS  Rhythm:Regular Rate:Normal     Neuro/Psych negative neurological ROS  negative psych ROS   GI/Hepatic negative GI ROS, Neg liver ROS,   Endo/Other  negative endocrine ROS  Renal/GU negative Renal ROS  negative genitourinary   Musculoskeletal negative musculoskeletal ROS (+)   Abdominal   Peds negative pediatric ROS (+)  Hematology negative hematology ROS (+)   Anesthesia Other Findings   Reproductive/Obstetrics negative OB ROS                             Anesthesia Physical Anesthesia Plan  ASA: I  Anesthesia Plan: General   Post-op Pain Management:    Induction: Intravenous  Airway Management Planned: Oral ETT  Additional Equipment:   Intra-op Plan:   Post-operative Plan: Extubation in OR  Informed Consent: I have reviewed the patients History and Physical, chart, labs and discussed the procedure including the risks, benefits and alternatives for the proposed anesthesia with the patient or authorized representative who has indicated his/her understanding and acceptance.   Dental advisory given  Plan Discussed with: CRNA  Anesthesia Plan Comments:         Anesthesia Quick Evaluation

## 2015-02-03 NOTE — Anesthesia Procedure Notes (Signed)
Procedure Name: Intubation Date/Time: 02/03/2015 8:34 AM Performed by: Danley Danker L Patient Re-evaluated:Patient Re-evaluated prior to inductionOxygen Delivery Method: Circle system utilized Preoxygenation: Pre-oxygenation with 100% oxygen Intubation Type: IV induction Ventilation: Mask ventilation without difficulty and Oral airway inserted - appropriate to patient size Laryngoscope Size: Sabra Heck and 2 Grade View: Grade I Tube type: Reinforced Tube size: 7.5 mm Number of attempts: 1 Airway Equipment and Method: Stylet Placement Confirmation: ETT inserted through vocal cords under direct vision,  breath sounds checked- equal and bilateral and positive ETCO2 Secured at: 21 cm Tube secured with: Tape Dental Injury: Teeth and Oropharynx as per pre-operative assessment

## 2015-02-03 NOTE — Interval H&P Note (Signed)
History and Physical Interval Note:  02/03/2015 7:53 AM  Rachael Murray  has presented today for surgery, with the diagnosis of LEFT THYROID NODULE, FOLLICULAR  The various methods of treatment have been discussed with the patient and family. After consideration of risks, benefits and other options for treatment, the patient has consented to  Procedure(s): LEFT THYROID LOBECTOMY POSSIBLE TOTAL (Left) as a surgical intervention .  The patient's history has been reviewed, patient examined, no change in status, stable for surgery.  I have reviewed the patient's chart and labs.  Questions were answered to the patient's satisfaction.     Rosario Jacks., Anne Hahn

## 2015-02-04 ENCOUNTER — Encounter (HOSPITAL_COMMUNITY): Payer: Self-pay | Admitting: General Surgery

## 2015-02-04 ENCOUNTER — Other Ambulatory Visit (INDEPENDENT_AMBULATORY_CARE_PROVIDER_SITE_OTHER): Payer: Self-pay

## 2015-02-04 DIAGNOSIS — D34 Benign neoplasm of thyroid gland: Secondary | ICD-10-CM | POA: Diagnosis not present

## 2015-02-04 NOTE — Anesthesia Postprocedure Evaluation (Signed)
  Anesthesia Post-op Note  Patient: Rachael Murray  Procedure(s) Performed: Procedure(s) (LRB): LEFT THYROID LOBECTOMY  (Left)  Patient Location: PACU  Anesthesia Type: General  Level of Consciousness: awake and alert   Airway and Oxygen Therapy: Patient Spontanous Breathing  Post-op Pain: mild  Post-op Assessment: Post-op Vital signs reviewed, Patient's Cardiovascular Status Stable, Respiratory Function Stable, Patent Airway and No signs of Nausea or vomiting  Last Vitals:  Filed Vitals:   02/04/15 0552  BP: 112/67  Pulse: 86  Temp: 36.9 C  Resp: 18    Post-op Vital Signs: stable   Complications: No apparent anesthesia complications

## 2015-02-04 NOTE — Discharge Summary (Signed)
Physician Discharge Summary  Patient ID: Rachael Murray MRN: [id number] DOB/AGE: 03/18/1971 44 y.o.  Admit date: 02/03/2015 Discharge date: 02/04/2015  Admission Diagnoses: s/p partial thyroidectomy  Discharge Diagnoses:  Active Problems:   S/P partial thyroidectomy   Discharged Condition: good  Hospital Course: post op the patient was sent to the floor.  She tolerated a diet well, and had good pain control.  She was ambulating well.  Pt was afebrile and deemed stable for DC and Dc'd home.  Consults: None  Significant Diagnostic Studies: none  Treatments: surgery: as above  Discharge Exam: Blood pressure 112/67, pulse 86, temperature 98.4 F (36.9 C), temperature source Oral, resp. rate 18, height 5\' 9"  (1.753 m), weight 179 lb (81.194 kg), last menstrual period 01/11/2015, SpO2 97 %. General appearance: alert and cooperative Incision/Wound: c/d/i  Disposition: 01-Home or Self Care     Medication List    TAKE these medications        eletriptan 20 MG tablet  Commonly known as:  RELPAX  One tablet by mouth as needed for migraine headache.  If the headache improves and then returns, dose may be repeated after 2 hours have elapsed since first dose (do not exceed 80 mg per day). may repeat in 2 hours if necessary     FISH OIL PO  Take 1 tablet by mouth every morning.     ibuprofen 200 MG tablet  Commonly known as:  ADVIL,MOTRIN  Take 400-800 mg by mouth every 6 (six) hours as needed for headache or moderate pain.     naproxen 500 MG tablet  Commonly known as:  NAPROSYN  Take 500 mg by mouth daily as needed for moderate pain (migraine).     ondansetron 4 MG disintegrating tablet  Commonly known as:  ZOFRAN-ODT  Take 4 mg by mouth every 8 (eight) hours as needed for nausea or vomiting.     oxyCODONE-acetaminophen 5-325 MG per tablet  Commonly known as:  ROXICET  Take 1-2 tablets by mouth every 4 (four) hours as needed for severe pain.           Follow-up  Information    Follow up with Reyes Ivan, MD. Schedule an appointment as soon as possible for a visit in 2 weeks.   Specialty:  General Surgery   Why:  For wound re-check   Contact information:   [place]  [address] [phone]       Signed: Rosario Jacks., [facility] 02/04/2015, 7:54 AM

## 2015-02-04 NOTE — Progress Notes (Signed)
Nurse reviewed discharge instructions with pt.  Pt verbalized understanding of discharge instructions, follow up appointments and new medication.  No concerns at time of discharge.  Prescription given to pt.

## 2015-02-04 NOTE — Progress Notes (Signed)
UR completed 

## 2016-01-23 IMAGING — CT CT NECK W/ CM
4 of 5 series · 15 of 33 positions shown, 17 images · IV contrast (Omni 300)
Comparison: Ultrasound 10/15/2014

CLINICAL DATA: Evaluate thyroid nodule

EXAM:
CT NECK WITH CONTRAST
TECHNIQUE: Multidetector CT imaging of the neck was performed using the
standard protocol following the bolus administration of intravenous
contrast.
CONTRAST:  75mL OMNIPAQUE IOHEXOL 300 MG/ML  SOLN

[Series 2: neck 2.0 i31s 3 · axial · 0.49mm/px · z∈[-233,-89]mm · 4 of 120 slices shown, 5 images]
[im 24/120  soft-tissue]
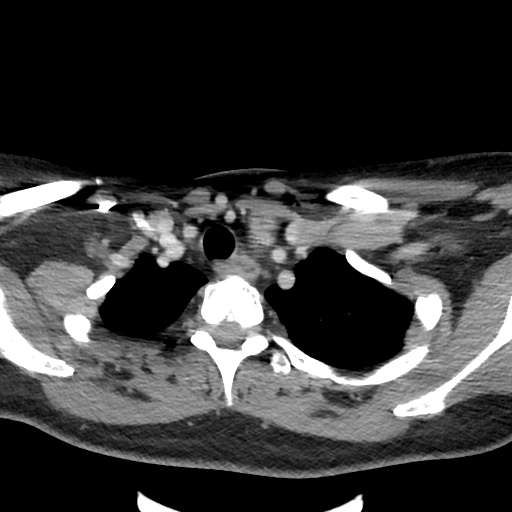
[im 24/120  bone]
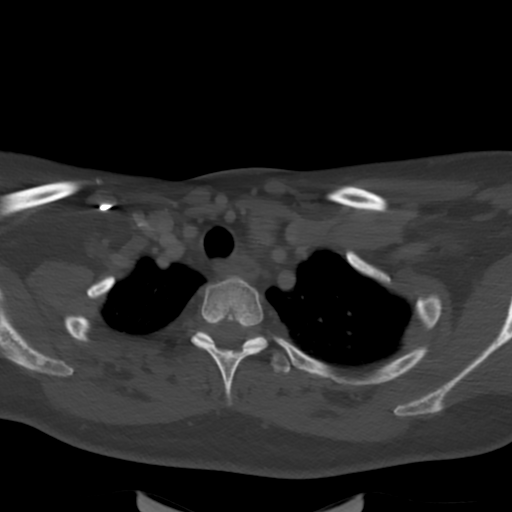
[im 48/120  bone]
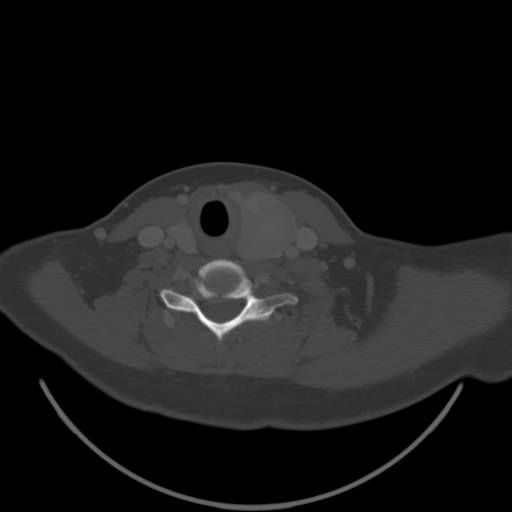
[im 72/120  bone]
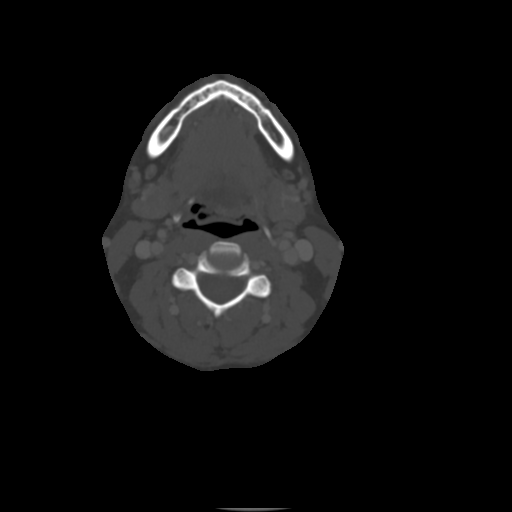
[im 96/120  bone]
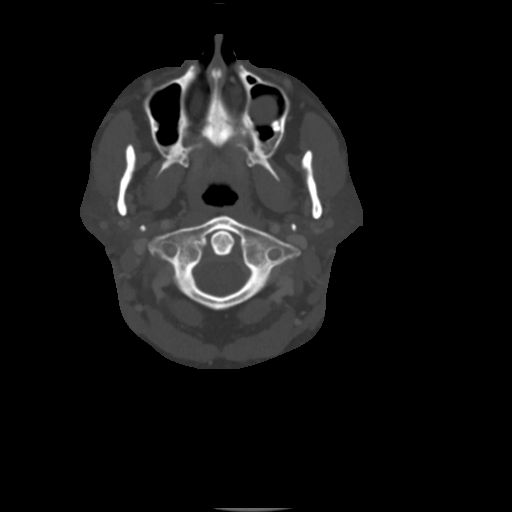

[Series 5: coronal st · coronal · 0.39mm/px · 3 of 91 slices shown]
[im 19/91  bone]
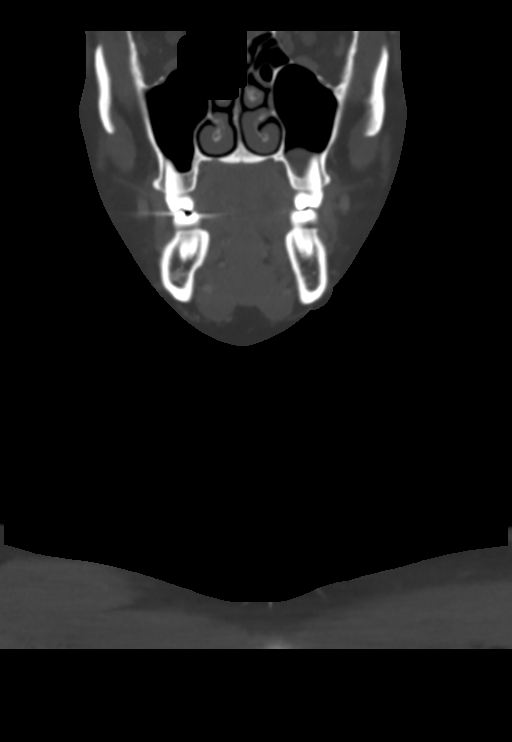
[im 37/91  bone]
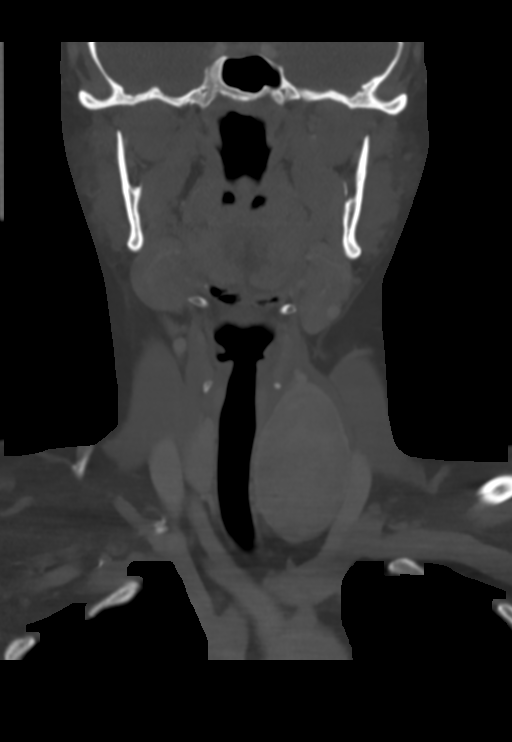
[im 55/91  bone]
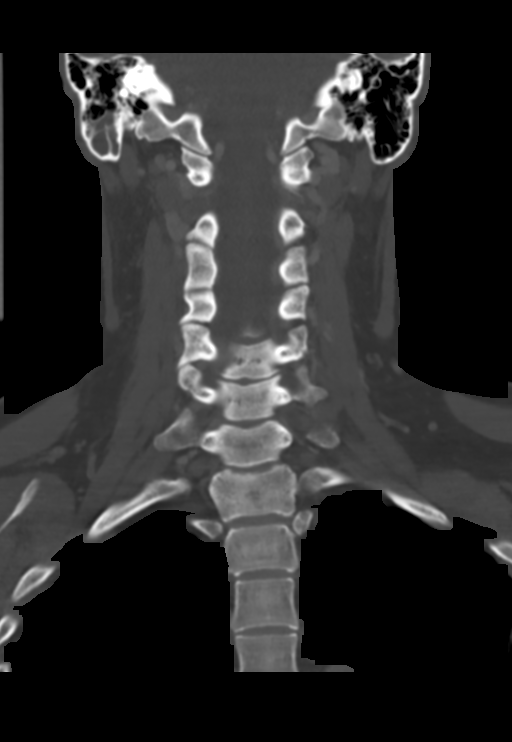

[Series 6: sagittal st · sagittal · 0.44mm/px · 5 of 85 slices shown, 6 images]
[im 29/85  bone]
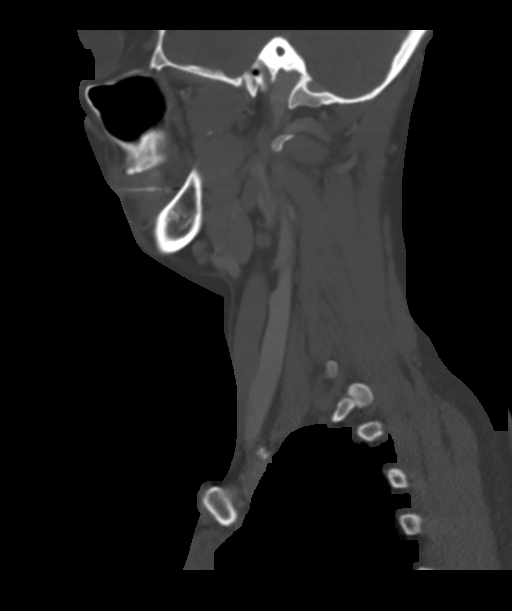
[im 36/85  bone]
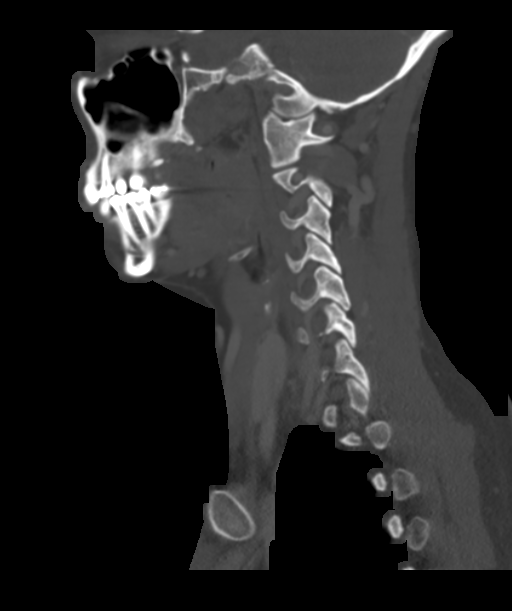
[im 43/85  soft-tissue]
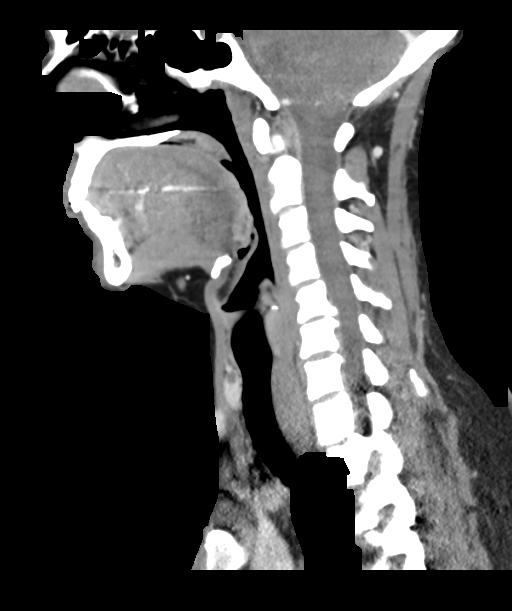
[im 43/85  bone]
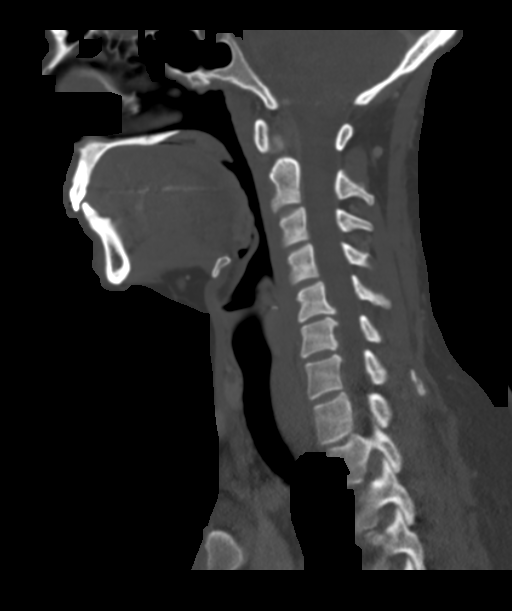
[im 50/85  bone]
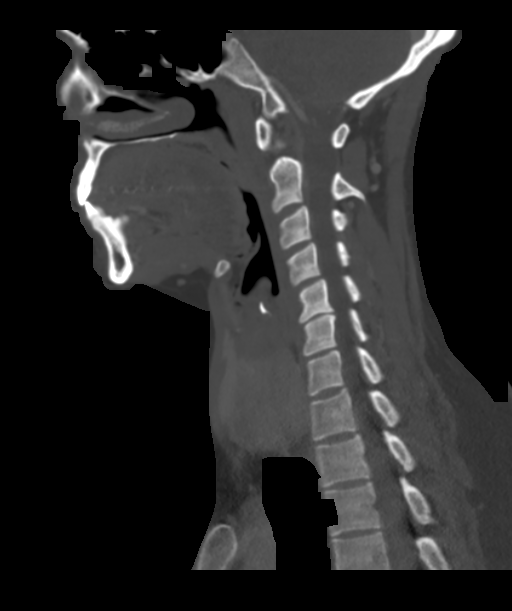
[im 57/85  bone]
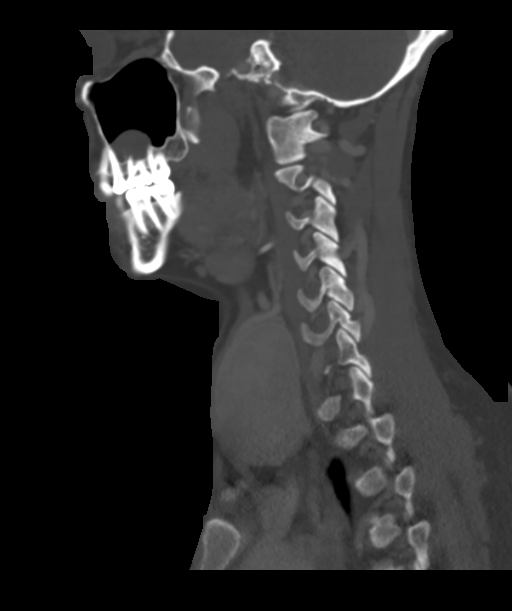

[Series 7: orthogonal st · axial · 0.39mm/px · z∈[-264,-166]mm · 3 of 129 slices shown]
[im 26/129  bone]
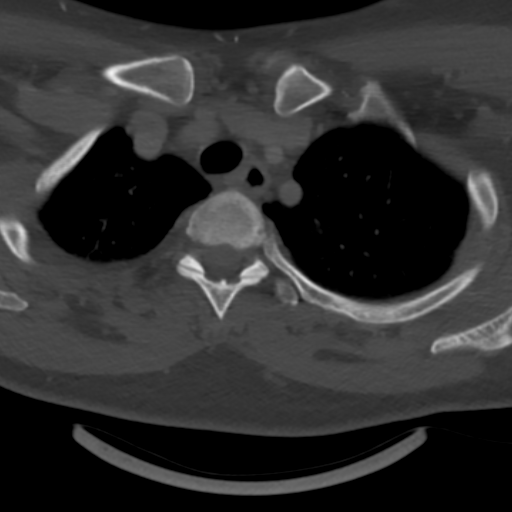
[im 52/129  bone]
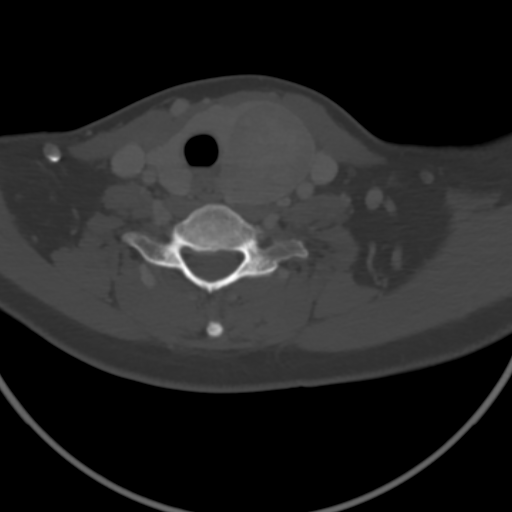
[im 77/129  bone]
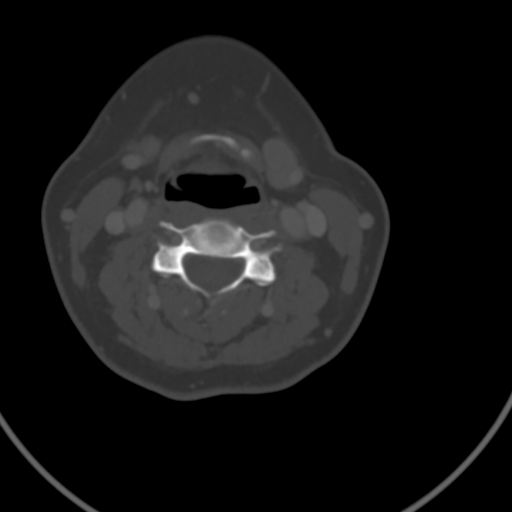

[15 of 33 positions shown; findings below may reference images not displayed]

FINDINGS: The visualized intracranial contents are unremarkable.

There is a 1.5 cm filling defect within the left maxillary sinus
which may represent a polyp or retention cysts. No significant
mucosal thickening are sinus fluid levels identified. There is
partial opacification of the right mastoid air cell. The left
mastoid air cells are clear.

Symmetric appearance of the parotid glands. The submandibular glands
are also normal in appearance. Dominant nodule in left lobe of
thyroid gland is relatively homogeneous in attenuation and slightly
hypodense to the adjacent normal thyroid tissue. This measures 4.2 x
3.3 x 6.0 cm. There is mass effect upon the trachea and esophagus
resulting in rightward deviation of the structures.

There is no cervical adenopathy identified.

Lung apices appear clear.

The visualized osseous structures are unremarkable.
IMPRESSION: 1. Dominant nodule in left lobe of thyroid gland is again
demonstrated. Findings meet consensus criteria for biopsy.
Ultrasound-guided fine needle aspiration should be considered, as
per the consensus statement:
Management of Thyroid Nodules Detected at US: Society of
Radiologists in Ultrasound Consensus Conference Statement. Radiology
6555; [DATE].

2. Partial opacification of the right mastoid air cells.

## 2016-07-12 ENCOUNTER — Other Ambulatory Visit: Payer: Self-pay | Admitting: Family Medicine

## 2016-07-12 DIAGNOSIS — R221 Localized swelling, mass and lump, neck: Secondary | ICD-10-CM

## 2016-07-12 DIAGNOSIS — Z1239 Encounter for other screening for malignant neoplasm of breast: Secondary | ICD-10-CM

## 2016-08-02 ENCOUNTER — Ambulatory Visit (INDEPENDENT_AMBULATORY_CARE_PROVIDER_SITE_OTHER): Payer: 59

## 2016-08-02 ENCOUNTER — Other Ambulatory Visit: Payer: 59

## 2016-08-02 DIAGNOSIS — Z1239 Encounter for other screening for malignant neoplasm of breast: Secondary | ICD-10-CM

## 2016-08-02 DIAGNOSIS — Z1231 Encounter for screening mammogram for malignant neoplasm of breast: Secondary | ICD-10-CM | POA: Diagnosis not present

## 2017-09-06 ENCOUNTER — Encounter: Payer: Self-pay | Admitting: Family Medicine

## 2018-08-29 DIAGNOSIS — F430 Acute stress reaction: Secondary | ICD-10-CM | POA: Diagnosis not present

## 2018-08-29 DIAGNOSIS — G43109 Migraine with aura, not intractable, without status migrainosus: Secondary | ICD-10-CM | POA: Diagnosis not present

## 2018-08-29 DIAGNOSIS — F321 Major depressive disorder, single episode, moderate: Secondary | ICD-10-CM | POA: Diagnosis not present

## 2018-10-02 MED FILL — FENOFIBRATE 54 MG TABLET: 54 | 30 days supply | Qty: 30 | Fill #0

## 2018-10-02 MED FILL — TOPIRAMATE 25 MG TAB: 25 | 30 days supply | Qty: 30 | Fill #0

## 2018-10-02 MED FILL — buPROPion HCL ER (XL) 150 M: 150 | 30 days supply | Qty: 30 | Fill #0

## 2018-10-29 DIAGNOSIS — F430 Acute stress reaction: Secondary | ICD-10-CM | POA: Diagnosis not present

## 2018-10-29 DIAGNOSIS — F321 Major depressive disorder, single episode, moderate: Secondary | ICD-10-CM | POA: Diagnosis not present

## 2018-10-29 MED FILL — BuPROPion HCL ER (XL) 300 M: 300 | 30 days supply | Qty: 30 | Fill #0

## 2018-12-12 DIAGNOSIS — G43909 Migraine, unspecified, not intractable, without status migrainosus: Secondary | ICD-10-CM | POA: Diagnosis not present

## 2018-12-20 MED FILL — buPROPion HCL ER (XL) 300 M: 300 | 30 days supply | Qty: 30 | Fill #1

## 2019-01-29 DIAGNOSIS — F430 Acute stress reaction: Secondary | ICD-10-CM | POA: Diagnosis not present

## 2019-01-29 DIAGNOSIS — F321 Major depressive disorder, single episode, moderate: Secondary | ICD-10-CM | POA: Diagnosis not present

## 2019-01-29 DIAGNOSIS — G43109 Migraine with aura, not intractable, without status migrainosus: Secondary | ICD-10-CM | POA: Diagnosis not present

## 2019-01-29 MED FILL — DESVENLAFAXINE SUC ER 50 MG: 50 | 30 days supply | Qty: 30 | Fill #0

## 2019-01-29 MED FILL — ONDANSETRON ODT 8 MG TABLET: 8 | 10 days supply | Qty: 30 | Fill #0

## 2019-01-29 MED FILL — buPROPion HCL ER (XL) 300 M: 300 | 30 days supply | Qty: 30 | Fill #0

## 2019-02-03 DIAGNOSIS — M62838 Other muscle spasm: Secondary | ICD-10-CM | POA: Diagnosis not present

## 2019-02-13 MED FILL — FENOFIBRATE 54 MG TABLET: 54 | 30 days supply | Qty: 30 | Fill #0 | Status: TO

## 2019-02-13 MED FILL — ONDANSETRON ODT 8 MG TABLET: 8 | 10 days supply | Qty: 30 | Fill #1

## 2019-02-13 MED FILL — TOPIRAMATE 25 MG TAB: 25 | 30 days supply | Qty: 30 | Fill #0 | Status: TO

## 2019-03-05 MED FILL — buPROPion HCL ER (XL) 300 M: 300 | 30 days supply | Qty: 30 | Fill #1 | Status: TO

## 2019-03-05 MED FILL — DESVENLAFAXINE SUC ER 50 MG: 50 | 30 days supply | Qty: 30 | Fill #1 | Status: TO

## 2019-04-02 MED FILL — TOPIRAMATE 25 MG TABLET: 25 | 30 days supply | Qty: 30 | Fill #0

## 2019-04-02 MED FILL — DESVENLAFAXINE SUC ER 50 MG: 50 | 30 days supply | Qty: 30 | Fill #0

## 2019-04-02 MED FILL — buPROPion HCL ER (XL) 300 M: 300 | 30 days supply | Qty: 30 | Fill #0

## 2019-04-02 MED FILL — FENOFIBRATE 54 MG TABLET: 54 | 30 days supply | Qty: 30 | Fill #0

## 2019-05-01 MED FILL — FENOFIBRATE 54 MG TABLET: 54 | 30 days supply | Qty: 30 | Fill #1

## 2019-05-01 MED FILL — TOPIRAMATE 25 MG TABLET: 25 | 30 days supply | Qty: 30 | Fill #1

## 2019-05-01 MED FILL — DESVENLAFAXINE SUC ER 50 MG: 50 | 30 days supply | Qty: 30 | Fill #0

## 2019-05-01 MED FILL — buPROPion HCL ER (XL) 300 M: 300 | 30 days supply | Qty: 30 | Fill #0

## 2019-06-02 MED FILL — DESVENLAFAXINE SUC ER 50 MG: 50 | 30 days supply | Qty: 30 | Fill #0

## 2019-06-02 MED FILL — FENOFIBRATE 54 MG TABLET: 54 | 30 days supply | Qty: 30 | Fill #0

## 2019-06-02 MED FILL — TOPIRAMATE 25 MG TAB: 25 | 30 days supply | Qty: 30 | Fill #0

## 2019-06-02 MED FILL — buPROPion HCL ER (XL) 300 M: 300 | 30 days supply | Qty: 30 | Fill #0

## 2019-07-07 MED FILL — buPROPion HCL ER (XL) 300 M: 300 | 30 days supply | Qty: 30 | Fill #1

## 2019-07-07 MED FILL — FENOFIBRATE 54 MG TABLET: 54 | 30 days supply | Qty: 30 | Fill #0

## 2019-07-07 MED FILL — DESVENLAFAXINE SUC ER 50 MG: 50 | 30 days supply | Qty: 30 | Fill #1

## 2019-07-07 MED FILL — TOPIRAMATE 25 MG TAB: 25 | 30 days supply | Qty: 30 | Fill #0

## 2019-07-22 MED FILL — ONDANSETRON ODT 8 MG TABLET: 8 | 10 days supply | Qty: 30 | Fill #2

## 2019-08-05 MED FILL — buPROPion HCL ER (XL) 300 M: 300 | 30 days supply | Qty: 30 | Fill #0

## 2019-08-05 MED FILL — FENOFIBRATE 54 MG TABLET: 54 | 30 days supply | Qty: 30 | Fill #1

## 2019-08-05 MED FILL — DESVENLAFAXINE SUC ER 50 MG: 50 | 30 days supply | Qty: 30 | Fill #0

## 2019-08-12 DIAGNOSIS — W548XXA Other contact with dog, initial encounter: Secondary | ICD-10-CM | POA: Diagnosis not present

## 2019-08-12 DIAGNOSIS — Z23 Encounter for immunization: Secondary | ICD-10-CM | POA: Diagnosis not present

## 2019-08-12 DIAGNOSIS — S0993XA Unspecified injury of face, initial encounter: Secondary | ICD-10-CM | POA: Diagnosis not present

## 2019-08-22 ENCOUNTER — Institutional Professional Consult (permissible substitution): Payer: 59 | Admitting: Plastic Surgery

## 2019-09-02 MED FILL — FENOFIBRATE 54 MG TABLET: 54 | 30 days supply | Qty: 30 | Fill #2

## 2019-09-02 MED FILL — buPROPion HCL ER (XL) 300 M: 300 | 30 days supply | Qty: 30 | Fill #0

## 2019-09-03 MED FILL — DESVENLAFAXINE SUC ER 50 MG: 50 | 30 days supply | Qty: 30 | Fill #0

## 2019-10-01 MED FILL — buPROPion HCL ER (XL) 300 M: 300 | 30 days supply | Qty: 30 | Fill #0

## 2019-10-01 MED FILL — DESVENLAFAXINE SUC ER 50 MG: 50 | 30 days supply | Qty: 30 | Fill #1

## 2019-10-01 MED FILL — FENOFIBRATE 54 MG TABLET: 54 | 30 days supply | Qty: 30 | Fill #3

## 2019-11-03 DIAGNOSIS — E890 Postprocedural hypothyroidism: Secondary | ICD-10-CM | POA: Diagnosis not present

## 2019-11-03 DIAGNOSIS — F430 Acute stress reaction: Secondary | ICD-10-CM | POA: Diagnosis not present

## 2019-11-03 DIAGNOSIS — E781 Pure hyperglyceridemia: Secondary | ICD-10-CM | POA: Diagnosis not present

## 2019-11-03 DIAGNOSIS — G43109 Migraine with aura, not intractable, without status migrainosus: Secondary | ICD-10-CM | POA: Diagnosis not present

## 2019-11-03 MED FILL — buPROPion HCL ER (XL) 300 M: 300 | 90 days supply | Qty: 90 | Fill #0

## 2019-11-03 MED FILL — FENOFIBRATE 54 MG TABLET: 54 | 90 days supply | Qty: 90 | Fill #0

## 2019-11-03 MED FILL — DESVENLAFAXINE SUC ER 50 MG: 50 | 90 days supply | Qty: 90 | Fill #0

## 2019-11-07 MED FILL — AIMOVIG 70 MG/ML SOAJ: 70 | 30 days supply | Qty: 1 | Fill #0

## 2019-11-27 ENCOUNTER — Other Ambulatory Visit: Payer: Self-pay

## 2019-11-27 ENCOUNTER — Emergency Department
Admission: EM | Admit: 2019-11-27 | Discharge: 2019-11-27 | Disposition: A | Discharge status: Home / Self Care | Payer: 59 | Source: Home / Self Care | Attending: Family Medicine | Admitting: Family Medicine

## 2019-11-27 ENCOUNTER — Encounter: Payer: Self-pay | Admitting: Emergency Medicine

## 2019-11-27 DIAGNOSIS — G43009 Migraine without aura, not intractable, without status migrainosus: Secondary | ICD-10-CM | POA: Diagnosis not present

## 2019-11-27 DIAGNOSIS — R112 Nausea with vomiting, unspecified: Secondary | ICD-10-CM

## 2019-11-27 MED ORDER — KETOROLAC TROMETHAMINE 60 MG/2ML IM SOLN: 60.0000 mg | Freq: Once | INTRAMUSCULAR | Status: DC

## 2019-11-27 MED ORDER — DEXAMETHASONE SODIUM PHOSPHATE 10 MG/ML IJ SOLN: 10.0000 mg | Freq: Once | INTRAMUSCULAR | Status: DC

## 2019-11-27 MED ORDER — METOCLOPRAMIDE HCL 5 MG/ML IJ SOLN: 5.0000 mg | Freq: Once | INTRAMUSCULAR | Status: DC

## 2019-11-27 NOTE — Discharge Instructions (Addendum)
Begin clear liquids for about 12 to 18 hours, then may begin a BRAT diet (Bananas, Rice, Applesauce, Toast) when nausea and vomiting resolved. Then gradually advance to a regular diet as tolerated.    May take Zofran as needed for nausea.

## 2019-11-27 NOTE — ED Provider Notes (Signed)
[facility]    CSN: [id number] Arrival date & time: 11/27/19  [place]      History   Chief Complaint Chief Complaint  Patient presents with  . Emesis    HPI Rachael Murray is a 48 y.o. female.   Patient developed nausea/vomiting while at work last night.  She then developed chills/sweats and a typical migraine headache that has persisted.  She has Zofran ODT which has not been helpful.  She denies other symptoms.   She denies chest tightness, shortness of breath, and changes in taste/smell.    The history is provided by the patient.  Emesis Severity:  Moderate Duration:  1 day Timing:  Intermittent Quality:  Stomach contents Progression:  Unchanged Chronicity:  New Recent urination:  Decreased Relieved by:  Nothing Associated symptoms: chills and headaches   Associated symptoms: no abdominal pain, no arthralgias, no cough, no diarrhea, no fever, no myalgias and no URI     Past Medical History:  Diagnosis Date  . Migraine     Patient Active Problem List   Diagnosis Date Noted  . 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 DILATION AND CURETTAGE OF UTERUS    . fractured arm w/ pins    . MYRINGOTOMY    . THYROID LOBECTOMY Left 02/03/2015   Procedure: LEFT THYROID LOBECTOMY ;  Surgeon: Ralene Ok, MD;  Location: [facility];  Service: General;  Laterality: Left;    OB History   No obstetric history on file.      Home Medications    Prior to Admission medications   Medication Sig Start Date End Date Taking? Authorizing Provider  buPROPion (WELLBUTRIN) 100 MG tablet Take 100 mg by mouth 2 (two) times daily.   Yes [provider]  cetirizine (ZYRTEC) 10 MG tablet Take 10 mg by mouth daily.   Yes [provider]  desvenlafaxine (PRISTIQ) 100 MG 24 hr tablet Take 100 mg by mouth daily.   Yes [provider]  fenofibrate (TRICOR) 145 MG tablet Take 145 mg by mouth daily.    Yes [provider]  eletriptan (RELPAX) 20 MG tablet One tablet by mouth as needed for migraine headache.  If the headache improves and then returns, dose may be repeated after 2 hours have elapsed since first dose (do not exceed 80 mg per day). may repeat in 2 hours if necessary     [provider]  ibuprofen (ADVIL,MOTRIN) 200 MG tablet Take 400-800 mg by mouth every 6 (six) hours as needed for headache or moderate pain.    [provider]  naproxen (NAPROSYN) 500 MG tablet Take 500 mg by mouth daily as needed for moderate pain (migraine).    [provider]  Omega-3 Fatty Acids (FISH OIL PO) Take 1 tablet by mouth every morning.    [provider]  ondansetron (ZOFRAN-ODT) 4 MG disintegrating tablet Take 4 mg by mouth every 8 (eight) hours as needed for nausea or vomiting.    [provider]    Family History Family History  Problem Relation Age of Onset  . Hypertension Father   . Asthma Mother   . COPD Mother     Social History Social History   Tobacco Use  . Smoking status: Never Smoker  . Smokeless tobacco: Never Used  Substance Use Topics  . Alcohol use: Yes    Comment: socially   . Drug use: No  Allergies   Aspirin   Review of Systems Review of Systems  Constitutional: Positive for chills, diaphoresis and fatigue. Negative for fever.  HENT: Negative.   Eyes: Negative.   Respiratory: Negative for cough.   Cardiovascular: Negative.   Gastrointestinal: Positive for nausea and vomiting. Negative for abdominal pain and diarrhea.  Genitourinary: Negative.   Musculoskeletal: Negative for arthralgias and myalgias.  Skin: Negative.   Neurological: Positive for headaches. Negative for dizziness, facial asymmetry and light-headedness.     Physical Exam Triage Vital Signs ED Triage Vitals  Enc Vitals Group     BP 11/27/19 1846 121/85     Pulse Rate 11/27/19 1846 88     Resp --      Temp 11/27/19 1846 98.2 F  (36.8 C)     Temp Source 11/27/19 1846 Oral     SpO2 11/27/19 1846 97 %     Weight 11/27/19 1847 190 lb (86.2 kg)     Height 11/27/19 1847 5\' 9"  (1.753 m)     Head Circumference --      Peak Flow --      Pain Score 11/27/19 1846 10     Pain Loc --      Pain Edu? --      Excl. in [place]? --    No data found.  Updated Vital Signs BP 121/85 (BP Location: Right Arm)   Pulse 88   Temp 98.2 F (36.8 C) (Oral)   Ht 5\' 9"  (1.753 m)   Wt 86.2 kg   SpO2 97%   BMI 28.06 kg/m   Visual Acuity Right Eye Distance:   Left Eye Distance:   Bilateral Distance:    Right Eye Near:   Left Eye Near:    Bilateral Near:     Physical Exam Nursing notes and Vital Signs reviewed. Appearance:  Patient appears stated age, and in no acute distress.  She is alert and oriented. Eyes:  Pupils are equal, round, and reactive to light and accomodation.  Extraocular movement is intact.  Conjunctivae are not inflamed.  Fundi benign.  No photophobia  Ears:  Canals normal.  Tympanic membranes normal.  Nose:  Mildly congested turbinates.  No sinus tenderness.   Pharynx:  Normal Neck:  Supple.  Mildly tender lateral nodes are present. Lungs:  Clear to auscultation.  Breath sounds are equal.  Moving air well. Heart:  Regular rate and rhythm without murmurs, rubs, or gallops.  Abdomen:  Nontender without masses or hepatosplenomegaly.  Bowel sounds are present.   Extremities:  No edema.  Skin:  No rash present.  Neurologic:  Cranial nerves 2 through 12 are normal.  Patellar and elbow reflexes are normal.  Cerebellar function is intact (finger-to-nose movement).  Gait and station are normal.    UC Treatments / Results  Labs (all labs ordered are listed, but only abnormal results are displayed) Labs Reviewed - No data to display  EKG   Radiology No results found.  Procedures Procedures (including critical care time)  Medications Ordered in UC Medications  ketorolac (TORADOL) injection 60 mg (has no  administration in time range)  metoCLOPramide (REGLAN) injection 5 mg (has no administration in time range)  dexamethasone (DECADRON) injection 10 mg (has no administration in time range)    Initial Impression / Assessment and Plan / UC Course  I have reviewed the triage vital signs and the nursing notes.  Pertinent labs & imaging results that were available during my care of the patient were reviewed  by me and considered in my medical decision making (see chart for details).    Normal neurologic exam reassuring. Administered Toradol 60mg  IM  Administered Decadron 10mg  IM.  Administered Reglan 5mg  IM.  If symptoms become significantly worse during the night or over the weekend, proceed to the local emergency room.    Final Clinical Impressions(s) / UC Diagnoses   Final diagnoses:  Migraine without aura and without status migrainosus, not intractable  Nausea and vomiting in adult     Discharge Instructions     Begin clear liquids for about 12 to 18 hours, then may begin a BRAT diet (Bananas, Rice, Applesauce, Toast) when nausea and vomiting resolved. Then gradually advance to a regular diet as tolerated.       ED Prescriptions    None        Kandra Nicolas, MD 11/27/19 [place]

## 2019-11-27 NOTE — ED Triage Notes (Signed)
Emesis started at 11p last night

## 2020-02-09 MED FILL — DESVENLAFAXINE SUC ER 50 MG: 50 | 90 days supply | Qty: 90 | Fill #1

## 2020-02-09 MED FILL — FENOFIBRATE 54 MG TABLET: 54 | 90 days supply | Qty: 90 | Fill #1

## 2020-02-09 MED FILL — BUPROPION HCL ER (XL) 300 M: 300 | 90 days supply | Qty: 90 | Fill #1

## 2020-02-26 DIAGNOSIS — W19XXXA Unspecified fall, initial encounter: Secondary | ICD-10-CM | POA: Diagnosis not present

## 2020-02-26 DIAGNOSIS — S92355A Nondisplaced fracture of fifth metatarsal bone, left foot, initial encounter for closed fracture: Secondary | ICD-10-CM | POA: Diagnosis not present

## 2020-02-26 DIAGNOSIS — M79672 Pain in left foot: Secondary | ICD-10-CM | POA: Diagnosis not present

## 2020-02-26 DIAGNOSIS — F430 Acute stress reaction: Secondary | ICD-10-CM | POA: Diagnosis not present

## 2020-02-27 DIAGNOSIS — M79672 Pain in left foot: Secondary | ICD-10-CM | POA: Diagnosis not present

## 2020-03-05 DIAGNOSIS — M79672 Pain in left foot: Secondary | ICD-10-CM | POA: Diagnosis not present

## 2020-03-22 DIAGNOSIS — M79672 Pain in left foot: Secondary | ICD-10-CM | POA: Diagnosis not present

## 2020-03-30 MED FILL — DESVENLAFAXINE SUC ER 100 M: 100 | 90 days supply | Qty: 90 | Fill #0

## 2020-04-07 MED FILL — AIMOVIG 70 MG/ML SOAJ: 70 | 30 days supply | Qty: 1 | Fill #0

## 2020-04-12 DIAGNOSIS — M79672 Pain in left foot: Secondary | ICD-10-CM | POA: Diagnosis not present

## 2020-04-12 DIAGNOSIS — S92355D Nondisplaced fracture of fifth metatarsal bone, left foot, subsequent encounter for fracture with routine healing: Secondary | ICD-10-CM | POA: Diagnosis not present

## 2020-04-20 MED FILL — AIMOVIG 70 MG/ML SOAJ: 70 | 30 days supply | Qty: 1 | Fill #0

## 2020-05-06 MED FILL — FENOFIBRATE 54 MG TABLET: 54 | 90 days supply | Qty: 90 | Fill #2

## 2020-05-06 MED FILL — BUPROPION HCL ER (XL) 300 M: 300 | 90 days supply | Qty: 90 | Fill #0

## 2020-06-01 MED FILL — AIMOVIG 70 MG/ML SOAJ: 70 | 30 days supply | Qty: 1 | Fill #1

## 2020-06-24 DIAGNOSIS — S3991XA Unspecified injury of abdomen, initial encounter: Secondary | ICD-10-CM | POA: Diagnosis not present

## 2020-06-24 DIAGNOSIS — R52 Pain, unspecified: Secondary | ICD-10-CM | POA: Diagnosis not present

## 2020-06-24 DIAGNOSIS — R10812 Left upper quadrant abdominal tenderness: Secondary | ICD-10-CM | POA: Diagnosis not present

## 2020-06-24 DIAGNOSIS — I10 Essential (primary) hypertension: Secondary | ICD-10-CM | POA: Diagnosis not present

## 2020-06-24 DIAGNOSIS — S20219A Contusion of unspecified front wall of thorax, initial encounter: Secondary | ICD-10-CM | POA: Diagnosis not present

## 2020-06-24 DIAGNOSIS — R109 Unspecified abdominal pain: Secondary | ICD-10-CM | POA: Diagnosis not present

## 2020-06-24 DIAGNOSIS — R1012 Left upper quadrant pain: Secondary | ICD-10-CM | POA: Diagnosis not present

## 2020-06-24 DIAGNOSIS — S299XXA Unspecified injury of thorax, initial encounter: Secondary | ICD-10-CM | POA: Diagnosis not present

## 2020-06-24 DIAGNOSIS — R079 Chest pain, unspecified: Secondary | ICD-10-CM | POA: Diagnosis not present

## 2020-06-24 DIAGNOSIS — G8911 Acute pain due to trauma: Secondary | ICD-10-CM | POA: Diagnosis not present

## 2020-06-25 DIAGNOSIS — R079 Chest pain, unspecified: Secondary | ICD-10-CM | POA: Diagnosis not present

## 2020-06-25 DIAGNOSIS — M7989 Other specified soft tissue disorders: Secondary | ICD-10-CM | POA: Diagnosis not present

## 2020-06-25 DIAGNOSIS — M79604 Pain in right leg: Secondary | ICD-10-CM | POA: Diagnosis not present

## 2020-06-25 DIAGNOSIS — S3991XA Unspecified injury of abdomen, initial encounter: Secondary | ICD-10-CM | POA: Diagnosis not present

## 2020-06-25 DIAGNOSIS — R2241 Localized swelling, mass and lump, right lower limb: Secondary | ICD-10-CM | POA: Diagnosis not present

## 2020-06-25 DIAGNOSIS — F329 Major depressive disorder, single episode, unspecified: Secondary | ICD-10-CM | POA: Diagnosis not present

## 2020-06-25 DIAGNOSIS — S8011XA Contusion of right lower leg, initial encounter: Secondary | ICD-10-CM | POA: Diagnosis not present

## 2020-06-25 DIAGNOSIS — G8911 Acute pain due to trauma: Secondary | ICD-10-CM | POA: Diagnosis not present

## 2020-06-25 DIAGNOSIS — S299XXA Unspecified injury of thorax, initial encounter: Secondary | ICD-10-CM | POA: Diagnosis not present

## 2020-06-25 DIAGNOSIS — S8991XA Unspecified injury of right lower leg, initial encounter: Secondary | ICD-10-CM | POA: Diagnosis not present

## 2020-06-25 DIAGNOSIS — Z041 Encounter for examination and observation following transport accident: Secondary | ICD-10-CM | POA: Diagnosis not present

## 2020-06-25 DIAGNOSIS — E785 Hyperlipidemia, unspecified: Secondary | ICD-10-CM | POA: Diagnosis not present

## 2020-06-25 DIAGNOSIS — R109 Unspecified abdominal pain: Secondary | ICD-10-CM | POA: Diagnosis not present

## 2020-06-29 MED FILL — DESVENLAFAXINE SUC ER 100 M: 100 | 90 days supply | Qty: 90 | Fill #1

## 2020-06-30 ENCOUNTER — Other Ambulatory Visit: Payer: Self-pay | Admitting: Family Medicine

## 2020-06-30 ENCOUNTER — Ambulatory Visit
Admission: RE | Admit: 2020-06-30 | Discharge: 2020-06-30 | Disposition: A | Discharge status: Home / Self Care | Payer: 59 | Source: Ambulatory Visit | Attending: Family Medicine | Admitting: Family Medicine

## 2020-06-30 DIAGNOSIS — S8011XA Contusion of right lower leg, initial encounter: Secondary | ICD-10-CM | POA: Diagnosis not present

## 2020-06-30 DIAGNOSIS — M79661 Pain in right lower leg: Secondary | ICD-10-CM

## 2020-06-30 DIAGNOSIS — M7989 Other specified soft tissue disorders: Secondary | ICD-10-CM

## 2020-06-30 DIAGNOSIS — S8011XS Contusion of right lower leg, sequela: Secondary | ICD-10-CM | POA: Diagnosis not present

## 2020-06-30 DIAGNOSIS — S8001XA Contusion of right knee, initial encounter: Secondary | ICD-10-CM | POA: Diagnosis not present

## 2020-07-05 DIAGNOSIS — Z79899 Other long term (current) drug therapy: Secondary | ICD-10-CM | POA: Diagnosis not present

## 2020-07-05 DIAGNOSIS — S8011XA Contusion of right lower leg, initial encounter: Secondary | ICD-10-CM | POA: Diagnosis not present

## 2020-07-05 DIAGNOSIS — Z791 Long term (current) use of non-steroidal anti-inflammatories (NSAID): Secondary | ICD-10-CM | POA: Diagnosis not present

## 2020-07-05 DIAGNOSIS — M79661 Pain in right lower leg: Secondary | ICD-10-CM | POA: Diagnosis not present

## 2020-07-05 DIAGNOSIS — G8911 Acute pain due to trauma: Secondary | ICD-10-CM | POA: Diagnosis not present

## 2020-07-05 DIAGNOSIS — S8011XD Contusion of right lower leg, subsequent encounter: Secondary | ICD-10-CM | POA: Diagnosis not present

## 2020-07-06 DIAGNOSIS — M25561 Pain in right knee: Secondary | ICD-10-CM | POA: Diagnosis not present

## 2020-07-06 DIAGNOSIS — M79661 Pain in right lower leg: Secondary | ICD-10-CM | POA: Diagnosis not present

## 2020-07-29 MED FILL — AIMOVIG 70 MG/ML SOAJ: 70 | 30 days supply | Qty: 1 | Fill #2

## 2020-08-09 MED FILL — FENOFIBRATE 54 MG TABLET: 54 | 90 days supply | Qty: 90 | Fill #3

## 2020-08-09 MED FILL — BUPROPION HCL ER (XL) 300 M: 300 | 90 days supply | Qty: 90 | Fill #1

## 2020-10-01 ENCOUNTER — Other Ambulatory Visit (HOSPITAL_COMMUNITY): Payer: Self-pay | Admitting: Physician Assistant

## 2020-10-01 MED FILL — DESVENLAFAXINE SUC ER 100 M: 100 | 30 days supply | Qty: 30 | Fill #0

## 2020-11-08 ENCOUNTER — Other Ambulatory Visit (HOSPITAL_COMMUNITY): Payer: Self-pay | Admitting: Nurse Practitioner

## 2020-11-08 MED FILL — FENOFIBRATE 54 MG TABLET: 54 | 30 days supply | Qty: 30 | Fill #0

## 2020-11-08 MED FILL — buPROPion HCL ER (XL) 300 M: 300 | 30 days supply | Qty: 30 | Fill #0

## 2020-11-09 ENCOUNTER — Other Ambulatory Visit (HOSPITAL_COMMUNITY): Payer: Self-pay | Admitting: Nurse Practitioner

## 2020-11-09 MED FILL — DESVENLAFAXINE SUC ER 100 M: 100 | 30 days supply | Qty: 30 | Fill #0

## 2020-12-06 MED FILL — buPROPion HCL ER (XL) 300 M: 300 | 30 days supply | Qty: 30 | Fill #1

## 2020-12-06 MED FILL — FENOFIBRATE 54 MG TABLET: 54 | 30 days supply | Qty: 30 | Fill #1

## 2021-01-11 MED FILL — buPROPion HCL ER (XL) 300 M: 300 | 30 days supply | Qty: 30 | Fill #2

## 2021-01-11 MED FILL — FENOFIBRATE 54 MG TABLET: 54 | 30 days supply | Qty: 30 | Fill #2

## 2021-02-08 ENCOUNTER — Other Ambulatory Visit (HOSPITAL_COMMUNITY): Payer: Self-pay | Admitting: Nurse Practitioner

## 2021-02-08 MED FILL — FENOFIBRATE 54 MG TABLET: 54 | 30 days supply | Qty: 30 | Fill #3

## 2021-02-08 MED FILL — buPROPion HCL ER (XL) 300 M: 300 | 30 days supply | Qty: 30 | Fill #0

## 2021-03-17 MED FILL — buPROPion HCL ER (XL) 300 M: 300 | 30 days supply | Qty: 30 | Fill #1

## 2021-03-17 MED FILL — FENOFIBRATE 54 MG TABLET: 54 | 30 days supply | Qty: 30 | Fill #4

## 2021-03-24 ENCOUNTER — Other Ambulatory Visit: Payer: Self-pay | Admitting: Family Medicine

## 2021-03-24 DIAGNOSIS — Z1231 Encounter for screening mammogram for malignant neoplasm of breast: Secondary | ICD-10-CM

## 2021-04-27 ENCOUNTER — Ambulatory Visit: Payer: 59

## 2021-06-23 ENCOUNTER — Ambulatory Visit (INDEPENDENT_AMBULATORY_CARE_PROVIDER_SITE_OTHER): Payer: 59

## 2021-06-23 ENCOUNTER — Other Ambulatory Visit: Payer: Self-pay

## 2021-06-23 DIAGNOSIS — Z1231 Encounter for screening mammogram for malignant neoplasm of breast: Secondary | ICD-10-CM | POA: Diagnosis not present

## 2021-11-09 ENCOUNTER — Other Ambulatory Visit: Payer: Self-pay
